# Patient Record
Sex: Male | Born: 1965 | Race: White | Hispanic: No | Smoking: Current every day smoker
Health system: Southern US, Community
[De-identification: ages and names within clinical notes are randomized; demographics above are authoritative.]

## PROBLEM LIST (undated history)

## (undated) DIAGNOSIS — F209 Schizophrenia, unspecified: Secondary | ICD-10-CM

## (undated) DIAGNOSIS — N200 Calculus of kidney: Secondary | ICD-10-CM

---

## 2004-10-15 ENCOUNTER — Other Ambulatory Visit: Payer: Self-pay

## 2004-10-15 ENCOUNTER — Emergency Department: Payer: Self-pay | Admitting: Emergency Medicine

## 2005-10-09 ENCOUNTER — Other Ambulatory Visit: Payer: Self-pay

## 2005-10-09 ENCOUNTER — Emergency Department: Payer: Self-pay | Admitting: Emergency Medicine

## 2005-11-03 ENCOUNTER — Emergency Department: Payer: Self-pay | Admitting: Emergency Medicine

## 2005-11-04 ENCOUNTER — Other Ambulatory Visit: Payer: Self-pay

## 2005-12-30 ENCOUNTER — Emergency Department: Payer: Self-pay | Admitting: Internal Medicine

## 2006-06-09 ENCOUNTER — Other Ambulatory Visit: Payer: Self-pay

## 2006-06-09 ENCOUNTER — Emergency Department: Payer: Self-pay | Admitting: Emergency Medicine

## 2006-06-30 ENCOUNTER — Emergency Department: Payer: Self-pay | Admitting: Emergency Medicine

## 2007-08-08 IMAGING — CT CT CHEST-ABD-PELV W/ CM
2 of 4 series · 13 of 36 positions shown, 19 images · IV contrast (APPLIED)
Comparison: none

REASON FOR EXAM: Chest pain
COMMENTS:

[Series 4: soft tissue · axial · 0.76mm/px · z∈[-696,-142]mm · 10 of 227 slices shown, 16 images]
[im 21/227  mediastinal]
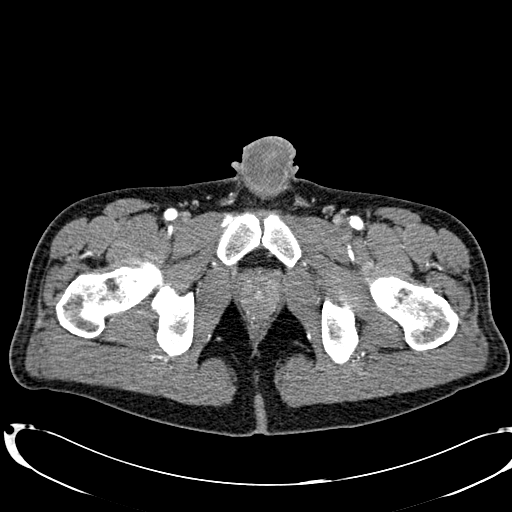
[im 21/227  bone]
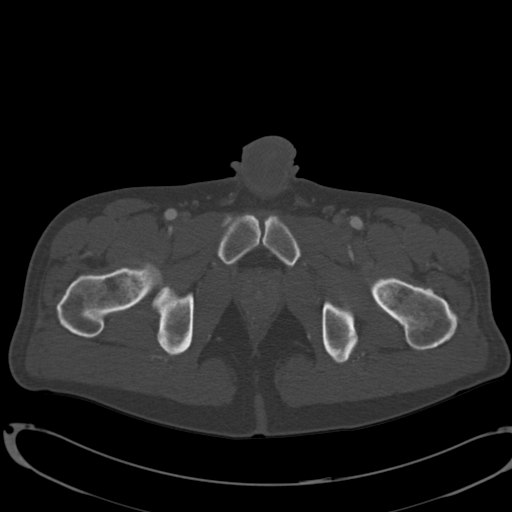
[im 42/227  mediastinal]
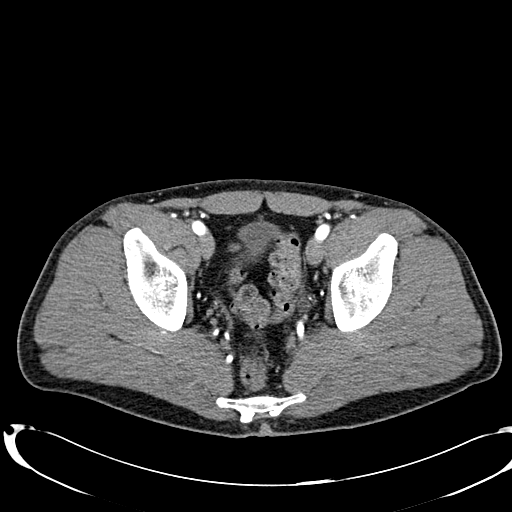
[im 62/227  mediastinal]
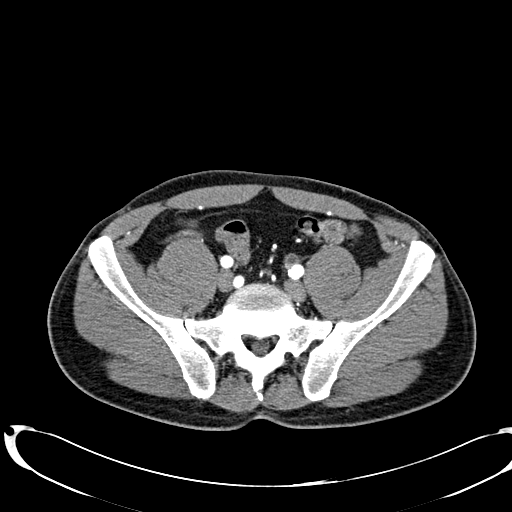
[im 83/227  mediastinal]
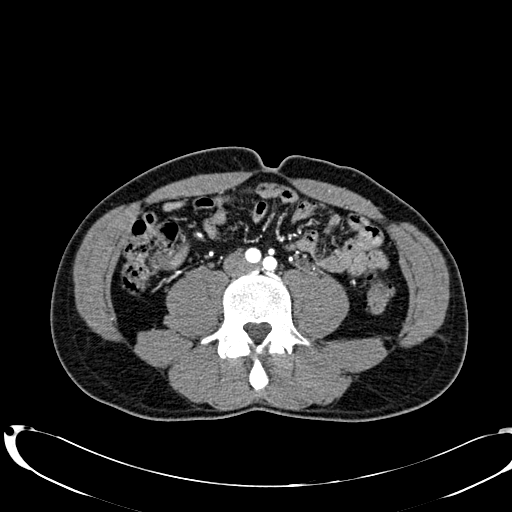
[im 103/227  mediastinal]
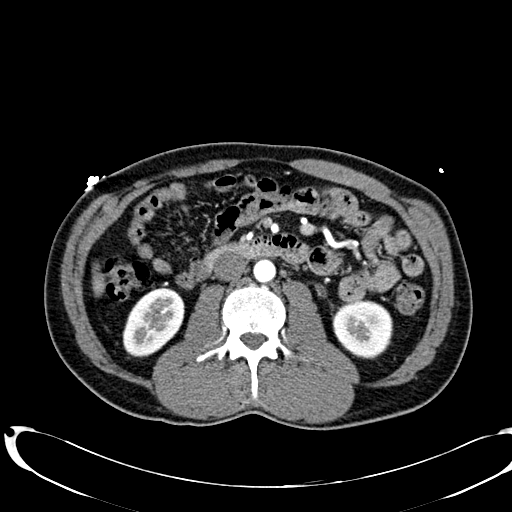
[im 124/227  mediastinal]
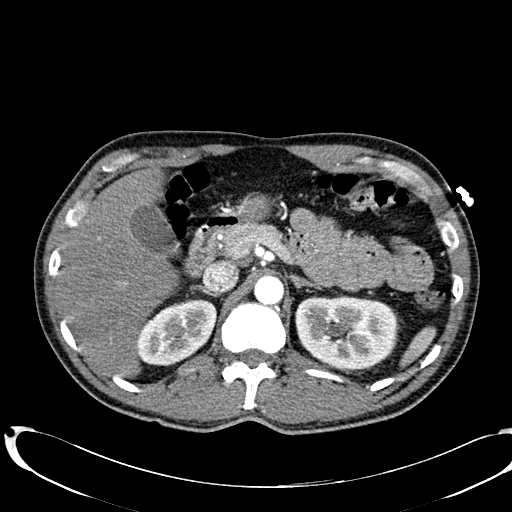
[im 144/227  mediastinal]
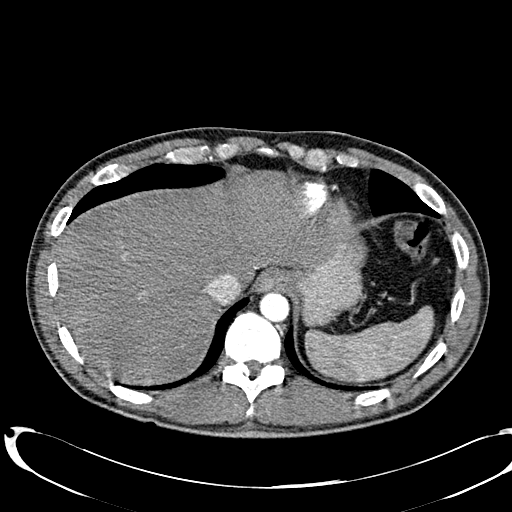
[im 144/227  lung]
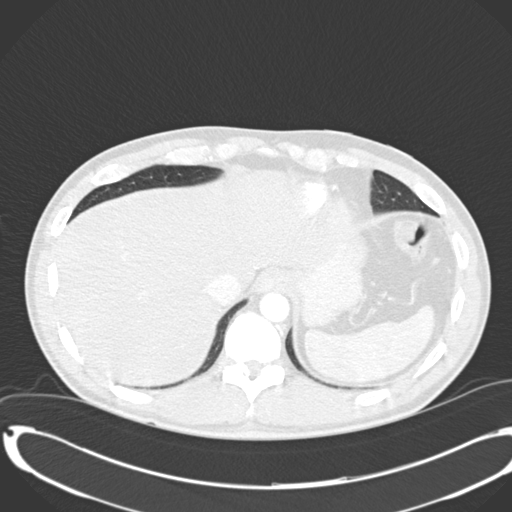
[im 165/227  mediastinal]
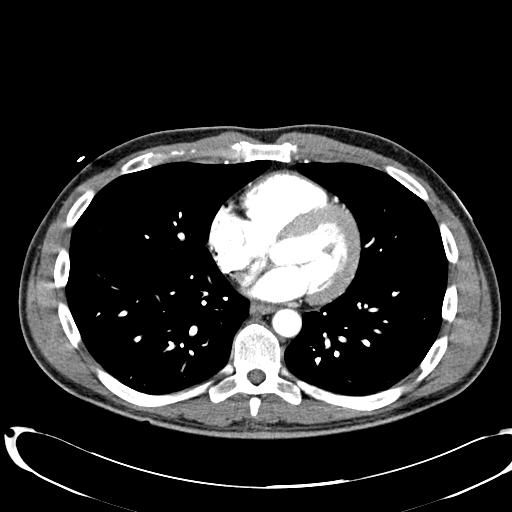
[im 165/227  lung]
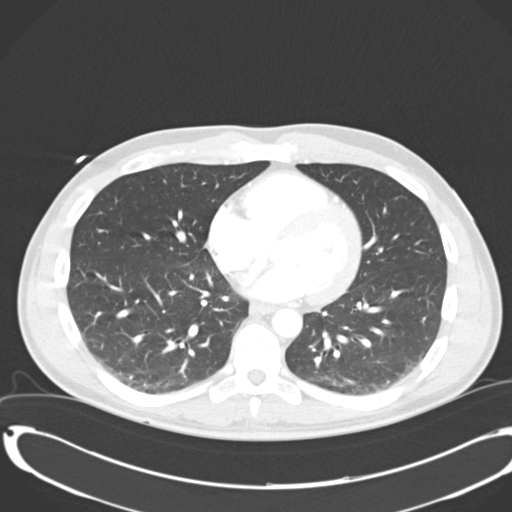
[im 185/227  mediastinal]
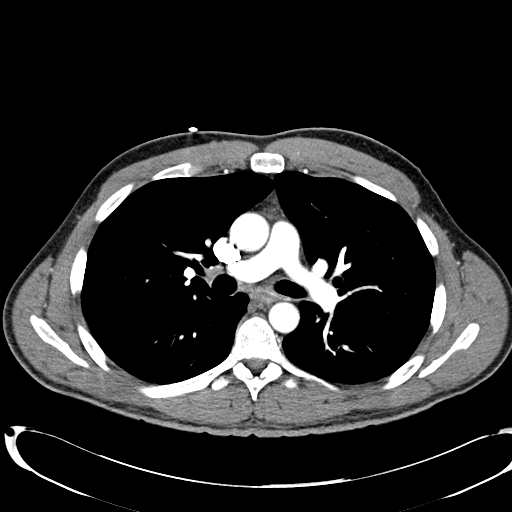
[im 185/227  lung]
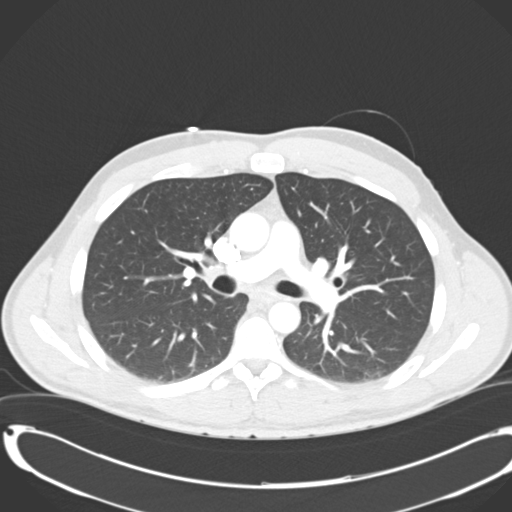
[im 185/227  bone]
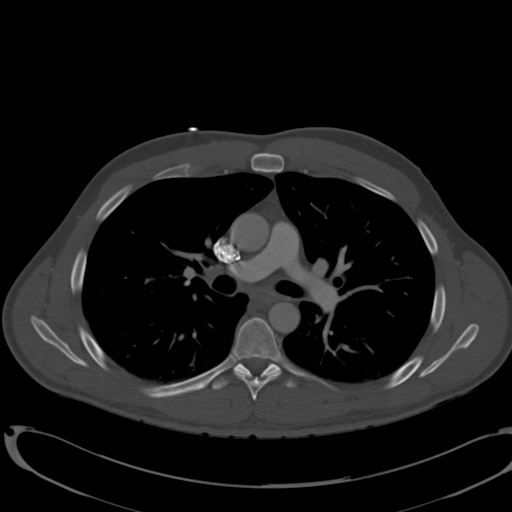
[im 206/227  mediastinal]
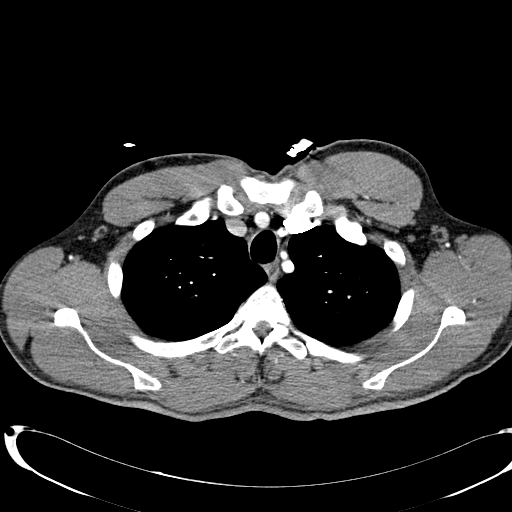
[im 206/227  lung]
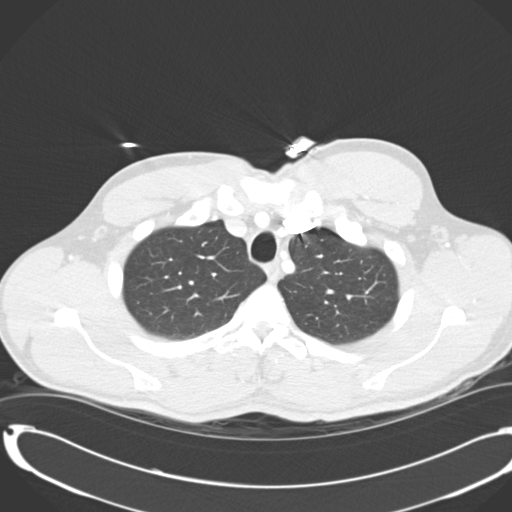

[Series 602: coronals · coronal · 1.33mm/px · 3 of 65 slices shown]
[im 13/65  mediastinal]
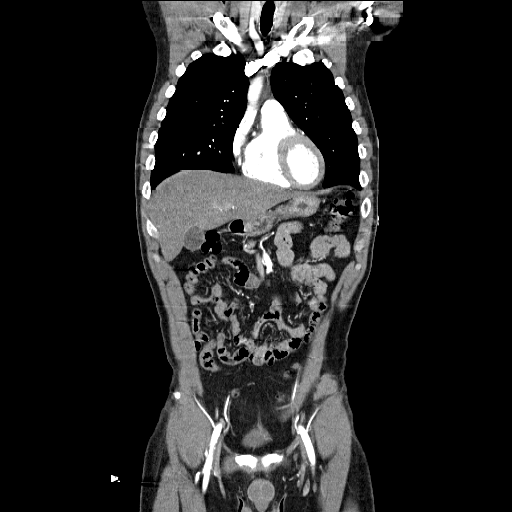
[im 26/65  mediastinal]
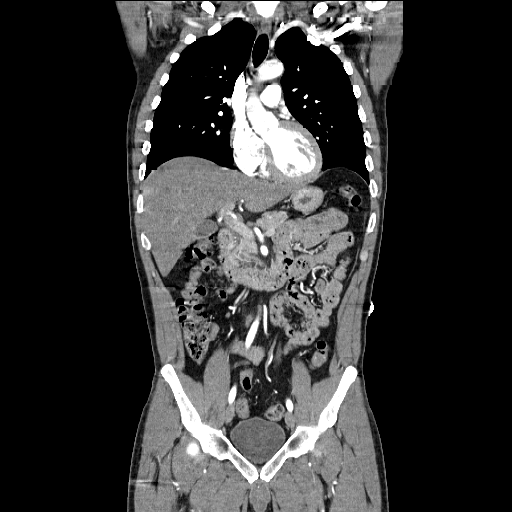
[im 39/65  mediastinal]
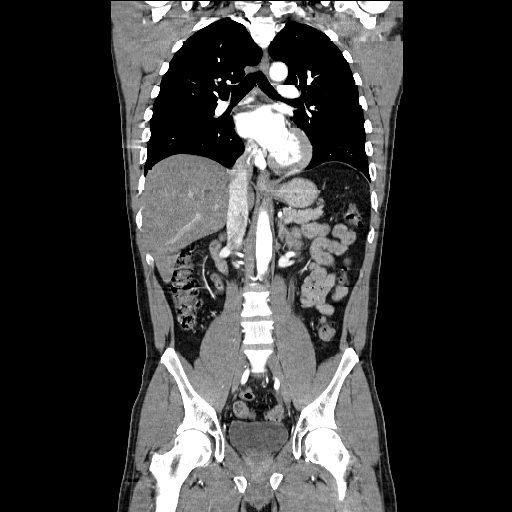

[13 of 36 positions shown; findings below may reference images not displayed]

PROCEDURE:     CT  - CT CHEST ABDOMEN AND PELVIS W  - October 09, 2005  [DATE]

RESULT:          An emergent contrast study was performed for severe back
pain and loss of consciousness.  Aortic dissection is of concern.

The exam was originally read by the [HOSPITAL].  On the chest CT, the
thoracic aorta appears intact.  No dissection is identified.  Within the
abdomen, no abdominal dissection is seen.  No aneurysm is noted.  A good
bolus of contrast is noted within the pulmonary arteries with no filling
defects noted to suggest pulmonary embolus.

Within the mediastinum, no mediastinal masses are identified, no hilar or
subcarinal adenopathy.  The lung fields are clear on the lung window
settings with no effusions noted.

Within the abdomen, there is seen a liver cyst present.  Both kidneys
excrete the contrast material.  No major organ abnormalities are seen.  No
fluid is noted in the abdomen.  There is noted mild LEFT hydronephrosis and
a 4 mm calculus at the LEFT ureterovesical junction.
IMPRESSION: 1.     No aortic dissection in the thorax or abdomen.
2.     No aneurysm is seen.
3.     A 4 mm calculus in the LEFT ureterovesical junction with LEFT
hydronephrosis.

## 2008-09-05 ENCOUNTER — Inpatient Hospital Stay: Payer: Self-pay | Admitting: Psychiatry

## 2008-10-07 ENCOUNTER — Inpatient Hospital Stay: Payer: Self-pay | Admitting: Psychiatry

## 2009-10-02 ENCOUNTER — Inpatient Hospital Stay: Payer: Self-pay | Admitting: Psychiatry

## 2013-01-12 ENCOUNTER — Emergency Department (HOSPITAL_COMMUNITY)
Admission: EM | Admit: 2013-01-12 | Discharge: 2013-01-12 | Disposition: A | Discharge status: Other Acute Inpatient Hospital | Payer: 59 | Attending: Emergency Medicine | Admitting: Emergency Medicine

## 2013-01-12 ENCOUNTER — Encounter (HOSPITAL_COMMUNITY): Payer: Self-pay

## 2013-01-12 DIAGNOSIS — F29 Unspecified psychosis not due to a substance or known physiological condition: Secondary | ICD-10-CM | POA: Insufficient documentation

## 2013-01-12 DIAGNOSIS — F329 Major depressive disorder, single episode, unspecified: Secondary | ICD-10-CM

## 2013-01-12 DIAGNOSIS — F3289 Other specified depressive episodes: Secondary | ICD-10-CM | POA: Insufficient documentation

## 2013-01-12 DIAGNOSIS — F1410 Cocaine abuse, uncomplicated: Secondary | ICD-10-CM | POA: Insufficient documentation

## 2013-01-12 LAB — RAPID URINE DRUG SCREEN, HOSP PERFORMED
Amphetamines: NOT DETECTED
Cocaine: POSITIVE — AB
Opiates: NOT DETECTED
Tetrahydrocannabinol: NOT DETECTED

## 2013-01-12 LAB — CBC WITH DIFFERENTIAL/PLATELET
Hemoglobin: 13.9 g/dL (ref 13.0–17.0)
Lymphocytes Relative: 25 % (ref 12–46)
Lymphs Abs: 1.5 10*3/uL (ref 0.7–4.0)
MCV: 94.3 fL (ref 78.0–100.0)
Neutrophils Relative %: 62 % (ref 43–77)
Platelets: 225 10*3/uL (ref 150–400)
RBC: 4.19 MIL/uL — ABNORMAL LOW (ref 4.22–5.81)
WBC: 6.1 10*3/uL (ref 4.0–10.5)

## 2013-01-12 LAB — COMPREHENSIVE METABOLIC PANEL
ALT: 46 U/L (ref 0–53)
Alkaline Phosphatase: 72 U/L (ref 39–117)
CO2: 27 mEq/L (ref 19–32)
GFR calc Af Amer: >90 mL/min (ref >90)
GFR calc non Af Amer: >90 mL/min (ref >90)
Glucose, Bld: 108 mg/dL — ABNORMAL HIGH (ref 70–99)
Potassium: 3.6 mEq/L (ref 3.5–5.1)
Sodium: 140 mEq/L (ref 135–145)
Total Bilirubin: 0.4 mg/dL (ref 0.3–1.2)

## 2013-01-12 LAB — URINALYSIS, ROUTINE W REFLEX MICROSCOPIC
Bilirubin Urine: NEGATIVE
Ketones, ur: NEGATIVE mg/dL
Nitrite: NEGATIVE
Urobilinogen, UA: 1 mg/dL (ref 0.0–1.0)

## 2013-01-12 MED ORDER — ACETAMINOPHEN 325 MG PO TABS: 650.0000 mg | ORAL_TABLET | ORAL | Status: DC | PRN

## 2013-01-12 MED ORDER — LORAZEPAM 1 MG PO TABS: 1.0000 mg | ORAL_TABLET | Freq: Three times a day (TID) | ORAL | Status: DC | PRN

## 2013-01-12 MED ORDER — BUPROPION HCL 75 MG PO TABS: 225.0000 mg | ORAL_TABLET | Freq: Every day | ORAL | Status: DC

## 2013-01-12 MED ORDER — ONDANSETRON HCL 4 MG PO TABS: 4.0000 mg | ORAL_TABLET | Freq: Three times a day (TID) | ORAL | Status: DC | PRN

## 2013-01-12 MED ORDER — ALUM & MAG HYDROXIDE-SIMETH 200-200-20 MG/5ML PO SUSP: 30.0000 mL | ORAL | Status: DC | PRN

## 2013-01-12 MED ORDER — ZIPRASIDONE MESYLATE 20 MG IM SOLR
20.0000 mg | Freq: Once | INTRAMUSCULAR | Status: AC
  Administered 2013-01-12: 20 mg via INTRAMUSCULAR

## 2013-01-12 MED ORDER — BUPROPION HCL 75 MG PO TABS
225.0000 mg | ORAL_TABLET | Freq: Two times a day (BID) | ORAL | Status: DC
  Filled 2013-01-12 (×4): qty 3

## 2013-01-12 MED ORDER — ZOLPIDEM TARTRATE 5 MG PO TABS: 5.0000 mg | ORAL_TABLET | Freq: Every evening | ORAL | Status: DC | PRN

## 2013-01-12 MED ORDER — IBUPROFEN 400 MG PO TABS: 600.0000 mg | ORAL_TABLET | Freq: Three times a day (TID) | ORAL | Status: DC | PRN

## 2013-01-12 NOTE — ED Provider Notes (Signed)
History  This chart was scribed for Dione Booze, MD,  by Concha Se, ED Scribe. The patient was seen in room APA16A/APA16A and the patient's care was started at 10:00 AM    CSN: [id number]  Arrival date & time 01/12/13  7829     Chief Complaint  Patient presents with  . V70.1   Level 5 Caveat- Pt psychiatric disorder  The history is provided by the patient and the police. No language interpreter was used.   HPI Comments: Harold Brooks is a 47 y.o. male who presents to the Emergency Department with [facility] for medical clearance.  Pt's family called police after pt stated that he was going to take an overdose of pills and when police came, he stated that he was talking to God. Pt is resistant to giving any previous social and medical history stating "I ain't got nothing to say to you". He has a h/o prior suicide attempts. He denies any suicidal or homicidal thoughts currently. Pt states frequent hallucinations but denies any today.  Pt states he is currently using marijuana and cocaine, last use of both has been within in the past 2 days. Pt is a current smoker and consumes alcohol.    No past medical history on file.  No past surgical history on file.  No family history on file.  History  Substance Use Topics  . Smoking status: Not on file  . Smokeless tobacco: Not on file  . Alcohol Use: Not on file      Review of Systems  Unable to perform ROS: Psychiatric disorder    Allergies  Review of patient's allergies indicates not on file.  Home Medications  No current outpatient prescriptions on file.  BP 156/82  Pulse 116  Resp 23  SpO2 98%  Physical Exam  Nursing note and vitals reviewed. Constitutional: He is oriented to person, place, and time. He appears well-developed and well-nourished. No distress.  uncooperative   HENT:  Head: Normocephalic and atraumatic.  Eyes: EOM are normal.  Neck: Neck supple. No tracheal deviation present.  Cardiovascular: Normal  rate.   Pulmonary/Chest: Effort normal. No respiratory distress.  Musculoskeletal: Normal range of motion.  Neurological: He is alert and oriented to person, place, and time.  Skin: Skin is warm and dry.  Psychiatric: His behavior is normal.  Flat affect    ED Course  Procedures (including critical care time)  DIAGNOSTIC STUDIES: Oxygen Saturation is 98% on room air, normal by my interpretation.    COORDINATION OF CARE: 10:01 AM-Discussed ED treatment with pt and pt agrees to treatment plan.   Results for orders placed during the hospital encounter of 01/12/13  CBC WITH DIFFERENTIAL      Result Value Range   WBC 6.1  4.0 - 10.5 K/uL   RBC 4.19 (*) 4.22 - 5.81 MIL/uL   Hemoglobin 13.9  13.0 - 17.0 g/dL   HCT 56.2  13.0 - 86.5 %   MCV 94.3  78.0 - 100.0 fL   MCH 33.2  26.0 - 34.0 pg   MCHC 35.2  30.0 - 36.0 g/dL   RDW 78.4  69.6 - 29.5 %   Platelets 225  150 - 400 K/uL   Neutrophils Relative % 62  43 - 77 %   Neutro Abs 3.7  1.7 - 7.7 K/uL   Lymphocytes Relative 25  12 - 46 %   Lymphs Abs 1.5  0.7 - 4.0 K/uL   Monocytes Relative 11  3 - 12 %  Monocytes Absolute 0.6  0.1 - 1.0 K/uL   Eosinophils Relative 3  0 - 5 %   Eosinophils Absolute 0.2  0.0 - 0.7 K/uL   Basophils Relative 1  0 - 1 %   Basophils Absolute 0.0  0.0 - 0.1 K/uL  COMPREHENSIVE METABOLIC PANEL      Result Value Range   Sodium 140  135 - 145 mEq/L   Potassium 3.6  3.5 - 5.1 mEq/L   Chloride 103  96 - 112 mEq/L   CO2 27  19 - 32 mEq/L   Glucose, Bld 108 (*) 70 - 99 mg/dL   BUN 15  6 - 23 mg/dL   Creatinine, Ser 5.62  0.50 - 1.35 mg/dL   Calcium 9.0  8.4 - 13.0 mg/dL   Total Protein 6.9  6.0 - 8.3 g/dL   Albumin 4.0  3.5 - 5.2 g/dL   AST 29  0 - 37 U/L   ALT 46  0 - 53 U/L   Alkaline Phosphatase 72  39 - 117 U/L   Total Bilirubin 0.4  0.3 - 1.2 mg/dL   GFR calc non Af Amer >90  >90 mL/min   GFR calc Af Amer >90  >90 mL/min  URINALYSIS, ROUTINE W REFLEX MICROSCOPIC      Result Value Range    Color, Urine YELLOW  YELLOW   APPearance HAZY (*) CLEAR   Specific Gravity, Urine 1.010  1.005 - 1.030   pH 7.0  5.0 - 8.0   Glucose, UA NEGATIVE  NEGATIVE mg/dL   Hgb urine dipstick NEGATIVE  NEGATIVE   Bilirubin Urine NEGATIVE  NEGATIVE   Ketones, ur NEGATIVE  NEGATIVE mg/dL   Protein, ur NEGATIVE  NEGATIVE mg/dL   Urobilinogen, UA 1.0  0.0 - 1.0 mg/dL   Nitrite NEGATIVE  NEGATIVE   Leukocytes, UA NEGATIVE  NEGATIVE  URINE RAPID DRUG SCREEN (HOSP PERFORMED)      Result Value Range   Opiates NONE DETECTED  NONE DETECTED   Cocaine POSITIVE (*) NONE DETECTED   Benzodiazepines NONE DETECTED  NONE DETECTED   Amphetamines NONE DETECTED  NONE DETECTED   Tetrahydrocannabinol NONE DETECTED  NONE DETECTED   Barbiturates NONE DETECTED  NONE DETECTED  ETHANOL      Result Value Range   Alcohol, Ethyl (B) <11  0 - 11 mg/dL    1. Depression   2. Cocaine abuse     CRITICAL CARE Performed by: Dione Booze Total critical care time: 35 minutes Critical care time was exclusive of separately billable procedures and treating other patients. Critical care was necessary to treat or prevent imminent or life-threatening deterioration. Critical care was time spent personally by me on the following activities: development of treatment plan with patient and/or surrogate as well as nursing, discussions with consultants, evaluation of patient's response to treatment, examination of patient, obtaining history from patient or surrogate, ordering and performing treatments and interventions, ordering and review of laboratory studies, ordering and review of radiographic studies, pulse oximetry and re-evaluation of patient's condition.   MDM  Patient with apparent acute psychosis. He is uncooperative and I am unable to do a good psychiatric evaluation on him. He was combative at triage and required sedation with ziprasidone. Psychiatric consultation will be obtained as well as screening labs. Records from  [facility] were reviewed and he presented to the ED with acute psychosis but apparently improved over the ensuing several days and was discharged without actual admission to a psychiatric hospital.  Laboratory workup does confirm that he has been taking cocaine. Psychiatric consultation is appreciated. Psychiatrist is Dr. that he is acutely suicidal or homicidal and not in need of acute psychiatric care but she is in need of substance abuse treatment. Consultation will be obtained with ACT Team.  I personally performed the services described in this documentation, which was scribed in my presence. The recorded information has been reviewed and is accurate.     Dione Booze, MD 01/12/13 1520
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
## 2013-01-12 NOTE — ED Notes (Signed)
Sitter remains at bedside, pt refused vitals at present, states "can't you see I'm eating!", will attempt to get vitals when pt done eating meal tray

## 2013-01-12 NOTE — ED Notes (Signed)
Pt's daughter called concerning father, contact number is 3 [phone] ( cell ), Melissa Suppes

## 2013-01-12 NOTE — Progress Notes (Signed)
Pt accepted by [facility] to Adult unit.

## 2013-01-12 NOTE — ED Notes (Signed)
Pt in with police. Pt combative, cursing and uncooperative. Per police officer pt was released from [facility] last week. Pt was making comments to family members he was going to take a hand full of meds. Per family pt had attempted to hang himself in the past. Pt

## 2013-01-12 NOTE — ED Notes (Signed)
Pt had bottle of clonazepm that was filled on 01/10/14. # 60, 1 mg tabs to be taken twice daily. Only 19 pill left in bottle.

## 2013-01-12 NOTE — ED Notes (Signed)
geodon 20 mg IM given by J. Verner Mould, RN prior to pt being triaged.

## 2013-01-12 NOTE — ED Provider Notes (Signed)
Pt accepted at [facility] by Dr. Eloisa Northern. IVC papers taken out due to reports of suicidal thoughts expressed to sister earlier today when he apparently called her and said he was 'in kidney failure'.   Quintavius Niebuhr B. Bernette Mayers, MD 01/12/13 2148

## 2013-01-12 NOTE — ED Notes (Signed)
Pt waking up. States he is cold and hungry. Warm blanket given and lunch tray. Officer has uncuffed pt but still remains here with pt.

## 2013-01-13 ENCOUNTER — Other Ambulatory Visit: Payer: Self-pay | Admitting: Medical

## 2013-01-13 ENCOUNTER — Encounter (HOSPITAL_COMMUNITY): Payer: Self-pay | Admitting: *Deleted

## 2013-01-13 ENCOUNTER — Inpatient Hospital Stay (HOSPITAL_COMMUNITY)
Admission: AD | Admit: 2013-01-13 | Discharge: 2013-01-17 | DRG: 885 | Disposition: A | Discharge status: Home / Self Care | Payer: Medicaid Other | Source: Intra-hospital | Attending: Psychiatry | Admitting: Psychiatry

## 2013-01-13 DIAGNOSIS — F1410 Cocaine abuse, uncomplicated: Secondary | ICD-10-CM | POA: Diagnosis present

## 2013-01-13 DIAGNOSIS — Z79899 Other long term (current) drug therapy: Secondary | ICD-10-CM

## 2013-01-13 DIAGNOSIS — F4310 Post-traumatic stress disorder, unspecified: Secondary | ICD-10-CM | POA: Diagnosis present

## 2013-01-13 DIAGNOSIS — F39 Unspecified mood [affective] disorder: Principal | ICD-10-CM | POA: Diagnosis present

## 2013-01-13 HISTORY — DX: Calculus of kidney: N20.0

## 2013-01-13 MED ORDER — CHLORDIAZEPOXIDE HCL 25 MG PO CAPS
25.0000 mg | ORAL_CAPSULE | Freq: Three times a day (TID) | ORAL | Status: DC | PRN
  Administered 2013-01-13 – 2013-01-14 (×4): 25 mg via ORAL
  Filled 2013-01-13 (×4): qty 1

## 2013-01-13 MED ORDER — ALUM & MAG HYDROXIDE-SIMETH 200-200-20 MG/5ML PO SUSP
30.0000 mL | ORAL | Status: DC | PRN
  Administered 2013-01-14: 30 mL via ORAL

## 2013-01-13 MED ORDER — HALOPERIDOL 5 MG PO TABS
5.0000 mg | ORAL_TABLET | Freq: Two times a day (BID) | ORAL | Status: DC
  Administered 2013-01-13: 5 mg via ORAL
  Filled 2013-01-13 (×5): qty 1

## 2013-01-13 MED ORDER — BUPROPION HCL 75 MG PO TABS
150.0000 mg | ORAL_TABLET | Freq: Two times a day (BID) | ORAL | Status: DC
  Administered 2013-01-13 – 2013-01-14 (×3): 150 mg via ORAL
  Filled 2013-01-13 (×7): qty 2

## 2013-01-13 MED ORDER — HYDROXYZINE HCL 50 MG PO TABS
50.0000 mg | ORAL_TABLET | Freq: Every evening | ORAL | Status: DC | PRN
  Administered 2013-01-13 – 2013-01-14 (×2): 50 mg via ORAL
  Filled 2013-01-13 (×3): qty 1

## 2013-01-13 MED ORDER — BENZTROPINE MESYLATE 1 MG PO TABS
1.0000 mg | ORAL_TABLET | Freq: Two times a day (BID) | ORAL | Status: DC
  Administered 2013-01-13 – 2013-01-17 (×10): 1 mg via ORAL
  Filled 2013-01-13 (×13): qty 1

## 2013-01-13 MED ORDER — CHLORPROMAZINE HCL 100 MG PO TABS
100.0000 mg | ORAL_TABLET | Freq: Every day | ORAL | Status: DC
  Administered 2013-01-13 – 2013-01-15 (×3): 100 mg via ORAL
  Filled 2013-01-13: qty 2
  Filled 2013-01-13 (×5): qty 1

## 2013-01-13 MED ORDER — ONDANSETRON HCL 4 MG PO TABS: 4.0000 mg | ORAL_TABLET | Freq: Three times a day (TID) | ORAL | Status: DC | PRN

## 2013-01-13 MED ORDER — GABAPENTIN 100 MG PO CAPS
200.0000 mg | ORAL_CAPSULE | Freq: Three times a day (TID) | ORAL | Status: DC
  Administered 2013-01-13 – 2013-01-14 (×5): 200 mg via ORAL
  Filled 2013-01-13 (×10): qty 2

## 2013-01-13 MED ORDER — IBUPROFEN 600 MG PO TABS
600.0000 mg | ORAL_TABLET | Freq: Three times a day (TID) | ORAL | Status: DC | PRN
  Administered 2013-01-13 – 2013-01-15 (×5): 600 mg via ORAL
  Filled 2013-01-13 (×5): qty 1

## 2013-01-13 MED ORDER — LORAZEPAM 1 MG PO TABS
1.0000 mg | ORAL_TABLET | ORAL | Status: AC
  Administered 2013-01-13: 1 mg via ORAL
  Filled 2013-01-13: qty 1

## 2013-01-13 MED ORDER — MAGNESIUM HYDROXIDE 400 MG/5ML PO SUSP: 30.0000 mL | Freq: Every day | ORAL | Status: DC | PRN

## 2013-01-13 MED ORDER — BUPROPION HCL 75 MG PO TABS
225.0000 mg | ORAL_TABLET | Freq: Two times a day (BID) | ORAL | Status: DC
  Filled 2013-01-13 (×3): qty 3

## 2013-01-13 MED ORDER — CLONAZEPAM 0.5 MG PO TABS
0.5000 mg | ORAL_TABLET | Freq: Two times a day (BID) | ORAL | Status: DC
  Administered 2013-01-13: 0.5 mg via ORAL
  Filled 2013-01-13: qty 1

## 2013-01-13 MED ORDER — CLONAZEPAM 0.5 MG PO TABS: 0.5000 mg | ORAL_TABLET | Freq: Three times a day (TID) | ORAL | Status: DC

## 2013-01-13 MED ORDER — CHLORPROMAZINE HCL 50 MG PO TABS
50.0000 mg | ORAL_TABLET | Freq: Three times a day (TID) | ORAL | Status: DC
  Administered 2013-01-14 – 2013-01-17 (×10): 50 mg via ORAL
  Filled 2013-01-13 (×13): qty 1

## 2013-01-13 MED ORDER — CHLORPROMAZINE HCL 100 MG PO TABS
100.0000 mg | ORAL_TABLET | ORAL | Status: AC | PRN
  Administered 2013-01-13: 100 mg via ORAL
  Filled 2013-01-13: qty 4

## 2013-01-13 MED ORDER — CHLORPROMAZINE HCL 50 MG PO TABS
50.0000 mg | ORAL_TABLET | Freq: Four times a day (QID) | ORAL | Status: DC
  Administered 2013-01-13 (×2): 50 mg via ORAL
  Filled 2013-01-13 (×8): qty 1

## 2013-01-13 MED ORDER — ACETAMINOPHEN 325 MG PO TABS
650.0000 mg | ORAL_TABLET | ORAL | Status: DC | PRN
  Administered 2013-01-13 – 2013-01-14 (×2): 650 mg via ORAL
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
## 2013-01-13 NOTE — Progress Notes (Signed)
Pt reports that he takes haldol 10 mg twice a day, cogentin 2 mg twice a day, klonopin 1 mg three times a day, and wellbutrin 150 mg daily

## 2013-01-13 NOTE — [facility] Group Notes (Signed)
[facility] Group Notes: (Clinical Social Work)   01/13/2013      Type of Therapy:  Group Therapy   Participation Level:  Did Not Attend    Ambrose Mantle, LCSW 01/13/2013, 5:38 PM

## 2013-01-13 NOTE — Progress Notes (Signed)
Psychoeducational Group Note  Date:  01/13/2013 Time:  1030  Group Topic/Focus:  Healthy Communication:   The focus of this group is to discuss communication, barriers to communication, as well as healthy ways to communicate with others.  Participation Level: Did Not Attend  Participation Quality:  Not Applicable  Affect:  Not Applicable  Cognitive:  Not Applicable  Insight:  Not Applicable  Engagement in Group: Not Applicable  Additional Comments:  Pt remained in room and walking the hall during group.   Sharyn Lull 01/13/2013, 1:28 PM

## 2013-01-13 NOTE — Tx Team (Signed)
Initial Interdisciplinary Treatment Plan  PATIENT STRENGTHS: (choose at least two) Ability for insight Average or above average intelligence  PATIENT STRESSORS: Medication change or noncompliance Substance abuse   PROBLEM LIST: Problem List/Patient Goals Date to be addressed Date deferred Reason deferred Estimated date of resolution  Depression 01/13/13     Substance Abuse 01/13/13                                                DISCHARGE CRITERIA:  Ability to meet basic life and health needs Improved stabilization in mood, thinking, and/or behavior Verbal commitment to aftercare and medication compliance  PRELIMINARY DISCHARGE PLAN: Attend aftercare/continuing care group Return to previous living arrangement  PATIENT/FAMIILY INVOLVEMENT: This treatment plan has been presented to and reviewed with the patient, Harold Brooks, and/or family member, .  The patient and family have been given the opportunity to ask questions and make suggestions.  Stacye Noori, [place] 01/13/2013, 2:05 AM

## 2013-01-13 NOTE — Progress Notes (Signed)
D: pt demonstrated a level of  of anxiety with agitation wanting to know why he was brought here and not taking his home medications. Also reports meds not working and if he does not have meds, he could act out. Has isolated himself from group verbalizing that he does not want anyone knowing his business; therefore he would not attend groups. Spent most of his day in bed sleeping.  A: pt met with psychiatrist and some changes were made to medications. Pt also encouraged by this rn to follow thru with treatment plan to allow for better results. This rn has had repeated verbal contacts with pt to encourage a change in behavior. 15 minute checks continues. R: decreased in anxiety noted. Pt is calm and more comfortable allowing expressions of feelings. Overall safety is maintained.
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
## 2013-01-14 MED ORDER — BUPROPION HCL ER (XL) 300 MG PO TB24
300.0000 mg | ORAL_TABLET | Freq: Every day | ORAL | Status: DC
  Administered 2013-01-15 – 2013-01-17 (×3): 300 mg via ORAL
  Filled 2013-01-14 (×6): qty 1

## 2013-01-14 MED ORDER — GABAPENTIN 300 MG PO CAPS
300.0000 mg | ORAL_CAPSULE | Freq: Three times a day (TID) | ORAL | Status: DC
  Administered 2013-01-14 – 2013-01-17 (×10): 300 mg via ORAL
  Filled 2013-01-14 (×14): qty 1

## 2013-01-14 MED ORDER — NICOTINE 21 MG/24HR TD PT24
21.0000 mg | MEDICATED_PATCH | Freq: Every day | TRANSDERMAL | Status: DC
  Administered 2013-01-14 – 2013-01-17 (×4): 21 mg via TRANSDERMAL
  Filled 2013-01-14 (×7): qty 1

## 2013-01-14 NOTE — Progress Notes (Signed)
Psychoeducational Group Note  Psychoeducational Group Note  Date: 01/14/2013 Time:  01/14/2013  Group Topic/Focus:  Gratefulness:  The focus of this group is to help patients identify what two things they are most grateful for in their lives. What helps ground them and to center them on their work to their recovery.  Participation Level: Did not attend Additional Comments:   Dione Housekeeper

## 2013-01-14 NOTE — [facility] Group Notes (Signed)
[facility] Group Notes: (Clinical Social Work)   01/14/2013      Type of Therapy:  Group Therapy   Participation Level:  Did Not Attend    Ambrose Mantle, LCSW 01/14/2013, 5:02 PM

## 2013-01-14 NOTE — Progress Notes (Signed)
D. Pt has been up periodically during the day today, limited interaction or participation in various activities. Pt has endorsed feelings of anxiety today and racing thoughts and did speak about how he rested well last evening. Pt has also complained of pain and has requested and received prn medication to assist. A. Support and encouragement provided, medication education completed. R. Pt verbalized understanding, will continue to monitor.

## 2013-01-14 NOTE — Clinical Social Work Note (Signed)
Clincal Social Work Note  Patient was irritable when awoken by CSW for PSA, complaining that he was not told when it was time for breakfast.  The MHT heated his breakfast which had been brought back to the unit, but he remained complaining and cursing.  He refused to answer any questions for the PSA and walked out, saying "I don't have time for any of that bulls---."  Ambrose Mantle, LCSW 01/14/2013, 8:55 AM

## 2013-01-14 NOTE — Progress Notes (Signed)
Psychoeducational Group Note  Date:  01/14/2013 Time:  1015  Group Topic/Focus:  Making Healthy Choices:   The focus of this group is to help patients identify negative/unhealthy choices they were using prior to admission and identify positive/healthier coping strategies to replace them upon discharge.  Participation Level:  Did not attend Harold Brooks 01/14/2013

## 2013-01-14 NOTE — Progress Notes (Signed)
[facility] MD Progress Note  01/14/2013 12:39 PM Harold Brooks  MRN:  [id number] Subjective:  Did sleep better last night. Feels that the two dosages of Thorazine he received in the evening might have been too much. Still wanting to be on a higher dose of Klonopin or "something stronger." He states that when he leaves he has a place to stay. Wants the medications to be right before he goes. He is sill admits to mood instability, racing thoughts.  Diagnosis:  Mood Disorder NOS, PTSD, Cocaine Abuse  ADL's:  Intact  Sleep: Fair  Appetite:  Fair  Suicidal Ideation:  Plan:  denies Intent:  denies Means:  denies Homicidal Ideation:  Plan:  denies Intent:  denies Means:  denies AEB (as evidenced by):  Psychiatric Specialty Exam: Review of Systems  Constitutional: Negative.   HENT: Negative.   Eyes: Negative.   Respiratory: Negative.   Cardiovascular: Negative.   Gastrointestinal: Negative.   Genitourinary: Negative.   Musculoskeletal: Negative.   Skin: Negative.   Neurological: Negative.   Endo/Heme/Allergies: Negative.   Psychiatric/Behavioral: Positive for depression, suicidal ideas and substance abuse. The patient is nervous/anxious and has insomnia.     Blood pressure 87/49, pulse 106, temperature 97.4 F (36.3 C), temperature source Oral, resp. rate 21, height 5\' 11"  (1.803 m), weight 72.122 kg (159 lb).Body mass index is 22.19 kg/(m^2).  General Appearance: Disheveled  Eye Solicitor::  Fair  Speech:  Clear and Coherent  Volume:  fluctuates  Mood:  Anxious and Irritable  Affect:  Labile  Thought Process:  Coherent and Goal Directed  Orientation:  Full (Time, Place, and Person)  Thought Content:  symptoms, medication focused, vague   Suicidal Thoughts:  Yes.  without intent/plan  Homicidal Thoughts:  No  Memory:  Immediate;   Fair Recent;   Fair Remote;   Fair  Judgement:  Fair  Insight:  Shallow  Psychomotor Activity:  Restlessness  Concentration:  Fair  Recall:  Fair   Akathisia:  No  Handed:  Right  AIMS (if indicated):     Assets:  Desire for Improvement  Sleep:  Number of Hours: 5.75   Current Medications: Current Facility-Administered Medications  Medication Dose Route Frequency Provider Last Rate Last Dose  . acetaminophen (TYLENOL) tablet 650 mg  650 mg Oral Q4H PRN Court Joy, PA-C   650 mg at 01/13/13 1904  . alum & mag hydroxide-simeth (MAALOX/MYLANTA) 200-200-20 MG/5ML suspension 30 mL  30 mL Oral PRN Court Joy, PA-C      . benztropine (COGENTIN) tablet 1 mg  1 mg Oral BID Verne Spurr, PA-C   1 mg at 01/14/13 4098  . buPROPion [facility]) tablet 150 mg  150 mg Oral BID Verne Spurr, PA-C   150 mg at 01/14/13 1191  . chlordiazePOXIDE (LIBRIUM) capsule 25 mg  25 mg Oral TID PRN Rachael Fee, MD   25 mg at 01/14/13 0930  . chlorproMAZINE (THORAZINE) tablet 100 mg  100 mg Oral QHS Rachael Fee, MD   100 mg at 01/13/13 2130  . chlorproMAZINE (THORAZINE) tablet 50 mg  50 mg Oral TID Rachael Fee, MD   50 mg at 01/14/13 1141  . gabapentin (NEURONTIN) capsule 200 mg  200 mg Oral TID Rachael Fee, MD   200 mg at 01/14/13 1140  . hydrOXYzine (ATARAX/VISTARIL) tablet 50 mg  50 mg Oral QHS PRN,MR X 1 Court Joy, PA-C   50 mg at 01/13/13 2043  . ibuprofen (ADVIL,MOTRIN) tablet 600  mg  600 mg Oral Q8H PRN Court Joy, PA-C   600 mg at 01/14/13 1141  . magnesium hydroxide (MILK OF MAGNESIA) suspension 30 mL  30 mL Oral Daily PRN Court Joy, PA-C      . nicotine (NICODERM CQ - dosed in mg/24 hours) patch 21 mg  21 mg Transdermal Daily Himabindu Ravi, MD      . ondansetron (ZOFRAN) tablet 4 mg  4 mg Oral Q8H PRN Court Joy, PA-C        Lab Results: No results found for this or any previous visit (from the past 48 hour(s)).  Physical Findings: AIMS: Facial and Oral Movements Muscles of Facial Expression: None, normal Lips and Perioral Area: None, normal Jaw: None, normal Tongue: None, normal,Extremity Movements Upper  (arms, wrists, hands, fingers): None, normal Lower (legs, knees, ankles, toes): None, normal, Trunk Movements Neck, shoulders, hips: None, normal, Overall Severity Severity of abnormal movements (highest score from questions above): None, normal Incapacitation due to abnormal movements: None, normal Patient's awareness of abnormal movements (rate only patient's report): No Awareness, Dental Status Current problems with teeth and/or dentures?: No Does patient usually wear dentures?: No  CIWA:    COWS:     Treatment Plan Summary: Daily contact with patient to assess and evaluate symptoms and progress in treatment Medication management  Plan: Supportive approach/coping skills/relapse prevention           Get more information           Optimize treatment with the Thorazine the Neurontin           Thorazine 50 mg TID, 100 mg HS            Increase the Neurontin to 300 mg TID Medical Decision Making Problem Points:  Review of last therapy session (1) and Review of psycho-social stressors (1) Data Points:  Review of medication regiment & side effects (2) Review of new medications or change in dosage (2)  I certify that inpatient services furnished can reasonably be expected to improve the patient's condition.   Kelicia Youtz A 01/14/2013, 12:39 PM
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
## 2013-01-15 MED ORDER — METHOCARBAMOL 500 MG PO TABS
500.0000 mg | ORAL_TABLET | Freq: Three times a day (TID) | ORAL | Status: DC
  Administered 2013-01-15 – 2013-01-17 (×8): 500 mg via ORAL
  Filled 2013-01-15 (×12): qty 1

## 2013-01-15 MED ORDER — SIMETHICONE 80 MG PO CHEW
80.0000 mg | CHEWABLE_TABLET | Freq: Four times a day (QID) | ORAL | Status: DC
  Administered 2013-01-15 – 2013-01-17 (×9): 80 mg via ORAL
  Filled 2013-01-15 (×15): qty 1

## 2013-01-15 MED ORDER — TRAZODONE HCL 100 MG PO TABS
100.0000 mg | ORAL_TABLET | Freq: Every evening | ORAL | Status: DC | PRN
  Administered 2013-01-15: 100 mg via ORAL
  Filled 2013-01-15: qty 1

## 2013-01-15 MED ORDER — ENSURE COMPLETE PO LIQD
237.0000 mL | Freq: Two times a day (BID) | ORAL | Status: DC
  Administered 2013-01-16 (×2): 237 mL via ORAL

## 2013-01-15 MED ORDER — NABUMETONE 500 MG PO TABS
500.0000 mg | ORAL_TABLET | Freq: Two times a day (BID) | ORAL | Status: DC
  Administered 2013-01-15: 500 mg via ORAL
  Filled 2013-01-15 (×6): qty 1

## 2013-01-15 MED ORDER — CLONAZEPAM 1 MG PO TABS
1.0000 mg | ORAL_TABLET | Freq: Two times a day (BID) | ORAL | Status: DC
  Administered 2013-01-15 – 2013-01-17 (×5): 1 mg via ORAL
  Filled 2013-01-15 (×5): qty 1

## 2013-01-15 MED ORDER — IBUPROFEN 800 MG PO TABS
800.0000 mg | ORAL_TABLET | Freq: Three times a day (TID) | ORAL | Status: DC | PRN
  Administered 2013-01-15 – 2013-01-17 (×2): 800 mg via ORAL
  Filled 2013-01-15 (×2): qty 1

## 2013-01-15 NOTE — Progress Notes (Signed)
D:  Patient's self inventory sheet, patient sleeps well, has good appetite, low energy level, poor attention span.  Rated depression and hopelessness #5.   Anxiety #10.  Has experienced withdrawals of tremors, chilling, cravings in past 24 hours.  Denied SI.  Has experienced neck/shoulder/back pain.  Zero pain goal, worst pain #8.  After discharge, will have someone to help him.  Has seen visions and stuff, voices come here and do this.  Does have discharge plans.  Will stay at cousin and mom's home.  Not sure about medications. A:  Medications administered per MD orders.  Emotional support and encouragement given throughout day. R:  Denied SI and HI.  Stated he does see visions and stuff.  Voices say come here and do this.   Will continue 15 minutes checks for safety.  Safety maintained. Patient has been intrusive today.  Slept this afternoon and did not go to recreation, would not wake up for ensure this afternoon.
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
## 2013-01-15 NOTE — Tx Team (Signed)
Interdisciplinary Treatment Plan Update   Date Reviewed:  01/15/2013  Time Reviewed:  9:54 AM  Progress in Treatment:   Attending groups: Yes Participating in groups: Yes Taking medication as prescribed: Yes  Tolerating medication: Yes Family/Significant other contact made: Yes, contact made with fiance. Patient understands diagnosis: Yes  Discussing patient identified problems/goals with staff: Yes Medical problems stabilized or resolved: Yes Denies suicidal/homicidal ideation: Yes Patient has not harmed self or others: Yes  For review of initial/current patient goals, please see plan of care.  Estimated Length of Stay:  Discharge home today  Reasons for Continued Hospitalization:   New Problems/Goals identified:    Discharge Plan or Barriers:   Home with outpatient follow up [facility]  Additional Comments:  Patient no longer endorsing SI/HI.  He is rating depression at two and anxiety at three.  MD to make adjustment to Thorazine prior to discharge.  Attendees:  Patient:  Harold Brooks 01/15/2013 9:54 AM   Signature: Patrick North, MD 01/15/2013 9:54 AM  Signature:Tina Arlana Pouch, RN 01/15/2013 9:54 AM  Signature: Harold Barban, RN 01/15/2013 9:54 AM  Signature: 01/15/2013 9:54 AM  Signature:  01/15/2013 9:54 AM  Signature:  Juline Patch, LCSW 01/15/2013 9:54 AM  Signature: Silverio Decamp, PMH-NP 01/15/2013 9:54 AM  Signature:  Maseta Dorley,Care Coordinator 01/15/2013 9:54 AM  Signature: 01/15/2013 9:54 AM  Signature:    Signature:    Signature:      Scribe for Treatment Team:   Juline Patch,  01/15/2013 9:54 AM

## 2013-01-15 NOTE — Progress Notes (Signed)
[facility] MD Progress Note  01/15/2013 3:27 PM Harold Brooks  MRN:  [id number] Subjective:  Having a problem with anxiety. States that he was on klonopin prescribed by MD and that he is not planning to take him off. Does think that the Thorazine is helping. He still endorses that he has not been able to recover from his wife's suicide in front of him. States he images, flashes.  Diagnosis:  PTSD, Mood Disorder NOS, Cocaine Abuse  ADL's:  Intact  Sleep: Poor  Appetite:  Poor  Suicidal Ideation:  Plan:  denies Intent:  denies Means:  denies Homicidal Ideation:  Plan:  denies Intent:  denies Means:  denies AEB (as evidenced by):  Psychiatric Specialty Exam: Review of Systems  Constitutional: Negative.   HENT: Negative.   Eyes: Negative.   Respiratory: Negative.   Cardiovascular: Negative.   Gastrointestinal: Negative.   Genitourinary: Negative.   Musculoskeletal: Positive for back pain.  Skin: Negative.   Neurological: Negative.   Endo/Heme/Allergies: Negative.   Psychiatric/Behavioral: Positive for depression and suicidal ideas. The patient is nervous/anxious and has insomnia.     Blood pressure 92/60, pulse 106, temperature 97.6 F (36.4 C), temperature source Oral, resp. rate 18, height 5\' 11"  (1.803 m), weight 72.122 kg (159 lb).Body mass index is 22.19 kg/(m^2).  General Appearance: Fairly Groomed  Patent attorney::  Fair  Speech:  Clear and Coherent  Volume:  fluctuates  Mood:  Anxious, Depressed and Irritable  Affect:  Restricted  Thought Process:  Coherent and Goal Directed  Orientation:  Full (Time, Place, and Person)  Thought Content:  worries, concerns  Suicidal Thoughts:  Yes.  without intent/plan  Homicidal Thoughts:  No  Memory:  Immediate;   Fair Recent;   Fair Remote;   Fair  Judgement:  Fair  Insight:  Shallow  Psychomotor Activity:  Restlessness  Concentration:  Fair  Recall:  Fair  Akathisia:  No  Handed:  Right  AIMS (if indicated):     Assets:   Desire for Improvement  Sleep:  Number of Hours: 3.5   Current Medications: Current Facility-Administered Medications  Medication Dose Route Frequency Provider Last Rate Last Dose  . alum & mag hydroxide-simeth (MAALOX/MYLANTA) 200-200-20 MG/5ML suspension 30 mL  30 mL Oral PRN Court Joy, PA-C   30 mL at 01/14/13 2210  . benztropine (COGENTIN) tablet 1 mg  1 mg Oral BID Verne Spurr, PA-C   1 mg at 01/15/13 0846  . buPROPion (WELLBUTRIN XL) 24 hr tablet 300 mg  300 mg Oral Daily Rachael Fee, MD   300 mg at 01/15/13 0847  . chlorproMAZINE (THORAZINE) tablet 100 mg  100 mg Oral QHS Rachael Fee, MD   100 mg at 01/14/13 2120  . chlorproMAZINE (THORAZINE) tablet 50 mg  50 mg Oral TID Rachael Fee, MD   50 mg at 01/15/13 1151  . clonazePAM (KLONOPIN) tablet 1 mg  1 mg Oral BID Rachael Fee, MD   1 mg at 01/15/13 1008  . gabapentin (NEURONTIN) capsule 300 mg  300 mg Oral TID Rachael Fee, MD   300 mg at 01/15/13 1152  . hydrOXYzine (ATARAX/VISTARIL) tablet 50 mg  50 mg Oral QHS PRN,MR X 1 Court Joy, PA-C   50 mg at 01/14/13 2332  . ibuprofen (ADVIL,MOTRIN) tablet 800 mg  800 mg Oral Q8H PRN Rachael Fee, MD      . magnesium hydroxide (MILK OF MAGNESIA) suspension 30 mL  30 mL Oral Daily  PRN Court Joy, PA-C      . methocarbamol (ROBAXIN) tablet 500 mg  500 mg Oral TID Nanine Means, NP   500 mg at 01/15/13 1203  . nabumetone (RELAFEN) tablet 500 mg  500 mg Oral BID Nanine Means, NP   500 mg at 01/15/13 0933  . nicotine (NICODERM CQ - dosed in mg/24 hours) patch 21 mg  21 mg Transdermal Daily Himabindu Ravi, MD   21 mg at 01/15/13 0608  . ondansetron (ZOFRAN) tablet 4 mg  4 mg Oral Q8H PRN Court Joy, PA-C      . simethicone [facility]) chewable tablet 80 mg  80 mg Oral QID Nanine Means, NP        Lab Results: No results found for this or any previous visit (from the past 48 hour(s)).  Physical Findings: AIMS: Facial and Oral Movements Muscles of Facial Expression:  None, normal Lips and Perioral Area: None, normal Jaw: None, normal Tongue: None, normal,Extremity Movements Upper (arms, wrists, hands, fingers): None, normal Lower (legs, knees, ankles, toes): None, normal, Trunk Movements Neck, shoulders, hips: None, normal, Overall Severity Severity of abnormal movements (highest score from questions above): None, normal Incapacitation due to abnormal movements: None, normal Patient's awareness of abnormal movements (rate only patient's report): No Awareness, Dental Status Current problems with teeth and/or dentures?: No Does patient usually wear dentures?: No  CIWA:    COWS:     Treatment Plan Summary: Daily contact with patient to assess and evaluate symptoms and progress in treatment Medication management  Plan: Supportive approach/coping skills/anger management           Will resume the Klonopin 1 mg BID           Will optimize treatment with the Neurontin 300 mg TID            Trazodone 100 mg HS PRN sleep             Medical Decision Making Problem Points:  Review of last therapy session (1) and Review of psycho-social stressors (1) Data Points:  Review or order clinical lab tests (1) Review of medication regiment & side effects (2) Review of new medications or change in dosage (2)  I certify that inpatient services furnished can reasonably be expected to improve the patient's condition.   Jullian Clayson A 01/15/2013, 3:27 PM

## 2013-01-15 NOTE — Progress Notes (Signed)
Recreation Therapy Notes  Date: 05.19.2014 Time: 3:00pm Location: [address] Hall Dayroom  Group Topic/Focus: Stress Managment   Participation Level:  Minimal   Participation Quality:  Resistant, Labile  Affect:  Euthymic   Cognitive:  Appropriate   Additional Comments: Activity: Guided Imagery & Progressive Muscle Relaxation; Explanation: Guided Imagery - Patients listened to recorded script about a day at the beach. Progressive Muscle Relaxation - LRT instructed patients in how to complete progressive muscle relaxation.   Patient was initially resistant to participating in group activity. Patient stated he has done "this type of stuff" before and it did not work for him. LRT encouraged patient to stay for the group session to try the activity. Patient began listening to the Guided Imagery script, but complained of not being able to hear it. Then patient complained that it was going to put him to sleep. At approximately 3:20pm patient exited dayroom. Patient did not return.   Jearl Klinefelter, LRT/ CTRS  Cumming, Sian Joles L 01/15/2013 4:10 PM

## 2013-01-15 NOTE — Progress Notes (Signed)
Patient ID: Harold Brooks, male   DOB: 1966/03/20, 47 y.o.   MRN: [id number] D)  Has been out on the hall pacing at times, intrusive, asking the same questions several times, slow to process, c/o feeling anxious and irritable and asking about klonopin.  States he knows they make 2 mg klonopins and doesn't understand why he doesn't have it ordered.  Was given librium, vistaril, and thorazine tonight , and was medicated earlier with motrin for his shoulder pain and given heat packs when he went to bed.  Stated they helped him relax and decreased the pain last night, wanted to try them again tonight.  Also was given extra snacks and a large salad, was asking for food and drinks frequently tonight. Did attend group, watched tv in between times. A)  Will continue to monitor for safety, continue POC R)  Safety maintained.

## 2013-01-15 NOTE — [facility] Group Notes (Signed)
[facility] LCSW Aftercare Discharge Planning Group Note   01/15/2013 4:23 PM  Participation Quality:  Appropriate  Mood/Affect:  Appropriate and Depressed  Depression Rating:  5  Anxiety Rating:  10  Thoughts of Suicide:  No  Will you contract for safety?   NA  Current AVH:  Yes  Plan for Discharge/Comments:  Patient advised of admitting to hospital with HI and SI.  He currently denies both.  Patient endorse using at least $100 per day in cocaine.  He is plans to return home to live with either his mother or cousin.  He is followed outpatient by [facility] in [place].  Transportation Means: Patient will need assistance with transportation.  Supports:  Patient has limited support system.   Kameka Whan, Joesph July

## 2013-01-15 NOTE — [facility] Group Notes (Signed)
[facility] LCSW Group Therapy        Overcoming Obstacles 1:15 2:30 PM         01/15/2013 4:21 PM  Type of Therapy:  Group Therapy  Participation Level:  Active  Participation Quality:  Appropriate  Affect:  Appropriate and Depressed  Cognitive:  Appropriate  Insight:  Developing/Improving and Engaged  Engagement in Therapy:  Developing/Improving and Engaged  Modes of Intervention:  Discussion, Education, Exploration, Problem-Solving, Rapport Building, Support  Summary of Progress/Problems:  Patient shared the obstacle he has to overcome is fear of being accepted by others.  He shared he has spend a quarter of his life in prison and does not fit in well with others.  Patient shared he is trying to take one day at a time.  Wynn Banker 01/15/2013, 4:21 PM

## 2013-01-16 LAB — GC/CHLAMYDIA PROBE AMP
CT Probe RNA: NEGATIVE
GC Probe RNA: NEGATIVE

## 2013-01-16 LAB — RPR: RPR Ser Ql: NONREACTIVE

## 2013-01-16 NOTE — Progress Notes (Signed)
Psychoeducational Group Note  Date:  01/16/2013 Time:  1100  Group Topic/Focus:  Recovery Goals:   The focus of this group is to identify appropriate goals for recovery and establish a plan to achieve them.  Participation Level: Did Not Attend   Additional Comments:  Patient did not attend group, patient remained in bed.  Karleen Hampshire Brittini 01/16/2013, 7:13 PM

## 2013-01-16 NOTE — Progress Notes (Signed)
D:  Patient's self inventory sheet, patient has fair sleep, good appetite, low energy level, improving attention span.  Rated depression #4, hopelessness #8, anxiety #6.  Has experienced tremors, chilling.  Denied SI.  Has felt lightheaded, pain in past 24 hours.  Pain goal #7, worst pain #7.  Plans to go to meetings after discharge.  No questions for staff.  No discharge plans.  No problems taking meds aafter discharge.  "But somedays just don't want to get out bed."    A:  Medications administered per MD order.  Continually asking about medications. R:  Denied SI and HI.  Denied A/V hallucinations.  Will continue 15 minute checks for safety, safety maintained.   Attended first group this morning.

## 2013-01-16 NOTE — Progress Notes (Signed)
D: Patient in the day room at the beginning of the shift. He came out of the day room and staggered; seemed as if he was going to fall.  Staff followed patient to his room. He appeared drowsy, irritable and kept asking for more medications.  Patient stated; " I'm not just gona sit here and do nothing, there is no TV in the room, what am I suppose to be doing" A: Writer offered patient a large cup of Gatorade and encouraged him to lay in his bed. Writer notified the [facility] and PA about holding patient's  HS medications since he appeared over sedated. PA  Authorized holding HS medications Staff  to check patient V/S. R: Patient attended group. Irritable and did not listen to directions. Will continue to monitor.

## 2013-01-16 NOTE — [facility] Group Notes (Signed)
[facility] LCSW Group Therapy      Feelings About Diagnosis 1:15 - 2:30 PM          01/16/2013 4:24 PM  Type of Therapy:  Group Therapy  Participation Level:  Minimal  Participation Quality:  Intrusive and Redirectable  Affect:  Irritable  Cognitive:  Lacking  Insight:  Distracting  Engagement in Therapy:  Distracting  Modes of Intervention:  Discussion, Education, Exploration, Problem-Solving, Rapport Building, Support  Summary of Progress/Problems:  Patient had to be redirected for having side conversation.  He was disruptive and left group early.  Wynn Banker 01/16/2013, 4:24 PM

## 2013-01-16 NOTE — Progress Notes (Signed)
[facility] MD Progress Note  01/16/2013 6:41 PM Harold Brooks  MRN:  [id number] Subjective:  Harold Brooks is sleeping better. He is having some sedation during the day but overall he feels he can work with these medications. Relieved to know that his test does not show evidence of STD's. He is still endorsing anxiety, but is willing to discuss with his MD any possible increase in his Klonopin Diagnosis:  PTSD, Cocaine abuse, Unspecified Mood Disorder  ADL's:  Intact  Sleep: Fair  Appetite:  Fair  Suicidal Ideation:  Plan:  denies Intent:  denies Means:  denies Homicidal Ideation:  Plan:  denies Intent:  denies Means:  denies AEB (as evidenced by):  Psychiatric Specialty Exam: Review of Systems  Constitutional: Negative.   HENT: Negative.   Eyes: Negative.   Respiratory: Negative.   Cardiovascular: Negative.   Gastrointestinal: Negative.   Genitourinary: Negative.   Musculoskeletal: Negative.   Skin: Negative.   Neurological: Negative.   Endo/Heme/Allergies: Negative.   Psychiatric/Behavioral: Positive for depression and substance abuse. The patient is nervous/anxious.     Blood pressure 113/76, pulse 98, temperature 98 F (36.7 C), temperature source Oral, resp. rate 20, height 5\' 11"  (1.803 m), weight 72.122 kg (159 lb).Body mass index is 22.19 kg/(m^2).  General Appearance: Fairly Groomed  Patent attorney::  Fair  Speech:  Clear and Coherent and Slow  Volume:  Decreased  Mood:  Anxious  Affect:  Restricted  Thought Process:  Coherent and Goal Directed  Orientation:  Full (Time, Place, and Person)  Thought Content:  somatically focused, worries, concerns  Suicidal Thoughts:  No  Homicidal Thoughts:  No  Memory:  Immediate;   Fair Recent;   Fair Remote;   Fair  Judgement:  Fair  Insight:  Shallow  Psychomotor Activity:  Restlessness  Concentration:  Fair  Recall:  Fair  Akathisia:  No  Handed:  Right  AIMS (if indicated):     Assets:  Desire for Improvement  Sleep:   Number of Hours: 5   Current Medications: Current Facility-Administered Medications  Medication Dose Route Frequency Provider Last Rate Last Dose  . alum & mag hydroxide-simeth (MAALOX/MYLANTA) 200-200-20 MG/5ML suspension 30 mL  30 mL Oral PRN Court Joy, PA-C   30 mL at 01/14/13 2210  . benztropine (COGENTIN) tablet 1 mg  1 mg Oral BID Verne Spurr, PA-C   1 mg at 01/16/13 1645  . buPROPion (WELLBUTRIN XL) 24 hr tablet 300 mg  300 mg Oral Daily Rachael Fee, MD   300 mg at 01/16/13 0820  . chlorproMAZINE (THORAZINE) tablet 100 mg  100 mg Oral QHS Rachael Fee, MD   100 mg at 01/15/13 2200  . chlorproMAZINE (THORAZINE) tablet 50 mg  50 mg Oral TID Rachael Fee, MD   50 mg at 01/16/13 1645  . clonazePAM (KLONOPIN) tablet 1 mg  1 mg Oral BID Rachael Fee, MD   1 mg at 01/16/13 1645  . feeding supplement (ENSURE COMPLETE) liquid 237 mL  237 mL Oral BID BM Rachael Fee, MD   237 mL at 01/16/13 1308  . gabapentin (NEURONTIN) capsule 300 mg  300 mg Oral TID Rachael Fee, MD   300 mg at 01/16/13 1646  . hydrOXYzine (ATARAX/VISTARIL) tablet 50 mg  50 mg Oral QHS PRN,MR X 1 Court Joy, PA-C   50 mg at 01/14/13 2332  . ibuprofen (ADVIL,MOTRIN) tablet 800 mg  800 mg Oral Q8H PRN Rachael Fee, MD  800 mg at 01/15/13 2205  . magnesium hydroxide (MILK OF MAGNESIA) suspension 30 mL  30 mL Oral Daily PRN Court Joy, PA-C      . methocarbamol (ROBAXIN) tablet 500 mg  500 mg Oral TID Nanine Means, NP   500 mg at 01/16/13 1646  . nicotine (NICODERM CQ - dosed in mg/24 hours) patch 21 mg  21 mg Transdermal Daily Himabindu Ravi, MD   21 mg at 01/16/13 0818  . ondansetron (ZOFRAN) tablet 4 mg  4 mg Oral Q8H PRN Court Joy, PA-C      . simethicone [facility]) chewable tablet 80 mg  80 mg Oral QID Nanine Means, NP   80 mg at 01/16/13 1646  . traZODone (DESYREL) tablet 100 mg  100 mg Oral QHS PRN Rachael Fee, MD   100 mg at 01/15/13 2317    Lab Results:    Physical Findings: AIMS:  Facial and Oral Movements Muscles of Facial Expression: None, normal Lips and Perioral Area: None, normal Jaw: None, normal Tongue: None, normal,Extremity Movements Upper (arms, wrists, hands, fingers): None, normal Lower (legs, knees, ankles, toes): None, normal, Trunk Movements Neck, shoulders, hips: None, normal, Overall Severity Severity of abnormal movements (highest score from questions above): None, normal Incapacitation due to abnormal movements: None, normal Patient's awareness of abnormal movements (rate only patient's report): No Awareness, Dental Status Current problems with teeth and/or dentures?: No Does patient usually wear dentures?: No  CIWA:  CIWA-Ar Total: 5 COWS:  COWS Total Score: 4  Treatment Plan Summary: Daily contact with patient to assess and evaluate symptoms and progress in treatment Medication management  Plan: Supportive approach/coping skills/relapse prevention           Optimize treatment with psychotropics           Consider discharge in the next 24-48 hours  Medical Decision Making Problem Points:  Review of last therapy session (1) and Review of psycho-social stressors (1) Data Points:  Review or order clinical lab tests (1) Review of medication regiment & side effects (2)  I certify that inpatient services furnished can reasonably be expected to improve the patient's condition.   Korina Tretter A 01/16/2013, 6:41 PM

## 2013-01-16 NOTE — Progress Notes (Signed)
Grief & Loss Group  The group discussed significant losses in their lives. Group members processed feelings related to grief and loss. Group members discussed self-love and respect for themselves, and the struggles and difficulties it took for them to get there.    Pt made a few comments as group was starting about how talking about loss would be hard for him and he didn't want to start crying. He left shortly after he made that comment. After group, the patient explained that he had left the group because he was not feeling well and did not want to hurt the facilitators feelings.  Sherol Dade Counselor Intern Haroldine Laws

## 2013-01-16 NOTE — Progress Notes (Signed)
D: Patient appeared drugy at the beginning of this shift, although he continued asking for more medications and would not agree to lay down in his bed. He denied SI/HI and denied hallucinations. He attended group. After receiving his bedtime medication he continued asking for more medications for pain and sleep. A: Writer encouraged patient to stay in his room to prevent falling.   R: Patient refused to stay  In bed until he received Trazodone for sleep.  Q 15 minute check continues as ordered to maintain safety.

## 2013-01-16 NOTE — Progress Notes (Signed)
Adult Psychoeducational Group Note  Date:  01/16/2013 Time:  10:25 PM  Group Topic/Focus:  Wrap-Up Group:   The focus of this group is to help patients review their daily goal of treatment and discuss progress on daily workbooks.  Participation Level:  Minimal  Participation Quality:  Drowsy  Affect:  Lethargic  Cognitive:  Disorganized and Lacking  Insight: Lacking  Engagement in Group:  Poor  Modes of Intervention:  Exploration and Support  Additional Comments:  Pt unable to participate in group.  Panzy Bubeck, Randal Buba 01/16/2013, 10:25 PM

## 2013-01-17 MED ORDER — CLONAZEPAM 1 MG PO TABS: 0.5000 mg | ORAL_TABLET | Freq: Two times a day (BID) | ORAL | Status: DC

## 2013-01-17 MED ORDER — TRAZODONE HCL 100 MG PO TABS: 100.0000 mg | ORAL_TABLET | Freq: Every evening | ORAL | Status: DC | PRN

## 2013-01-17 MED ORDER — CHLORPROMAZINE HCL 50 MG PO TABS: 50.0000 mg | ORAL_TABLET | Freq: Two times a day (BID) | ORAL | Status: DC

## 2013-01-17 MED ORDER — GABAPENTIN 300 MG PO CAPS: 300.0000 mg | ORAL_CAPSULE | Freq: Three times a day (TID) | ORAL | Status: DC

## 2013-01-17 MED ORDER — PANTOPRAZOLE SODIUM 40 MG PO TBEC: 40.0000 mg | DELAYED_RELEASE_TABLET | Freq: Every day | ORAL | Status: DC

## 2013-01-17 MED ORDER — MENTHOL 3 MG MT LOZG
1.0000 | LOZENGE | OROMUCOSAL | Status: DC | PRN
  Administered 2013-01-17 (×2): 3 mg via ORAL

## 2013-01-17 MED ORDER — PANTOPRAZOLE SODIUM 40 MG PO TBEC
40.0000 mg | DELAYED_RELEASE_TABLET | Freq: Every day | ORAL | Status: DC
  Administered 2013-01-17: 40 mg via ORAL
  Filled 2013-01-17 (×4): qty 1

## 2013-01-17 MED ORDER — BENZTROPINE MESYLATE 1 MG PO TABS: 1.0000 mg | ORAL_TABLET | Freq: Two times a day (BID) | ORAL | Status: DC

## 2013-01-17 MED ORDER — CHLORPROMAZINE HCL 50 MG PO TABS
50.0000 mg | ORAL_TABLET | Freq: Two times a day (BID) | ORAL | Status: DC
  Filled 2013-01-17 (×4): qty 1

## 2013-01-17 MED ORDER — CLONAZEPAM 0.5 MG PO TABS
0.5000 mg | ORAL_TABLET | Freq: Two times a day (BID) | ORAL | Status: DC
  Administered 2013-01-17: 0.5 mg via ORAL
  Filled 2013-01-17: qty 1

## 2013-01-17 MED ORDER — METHOCARBAMOL 500 MG PO TABS: 500.0000 mg | ORAL_TABLET | Freq: Three times a day (TID) | ORAL | Status: DC

## 2013-01-17 MED ORDER — CLONAZEPAM 0.5 MG PO TABS: 1.0000 mg | ORAL_TABLET | Freq: Two times a day (BID) | ORAL | Status: DC

## 2013-01-17 MED ORDER — BUPROPION HCL ER (XL) 300 MG PO TB24: 300.0000 mg | ORAL_TABLET | Freq: Every day | ORAL | Status: DC

## 2013-01-17 MED ORDER — CLONAZEPAM 0.5 MG PO TABS: 0.5000 mg | ORAL_TABLET | Freq: Two times a day (BID) | ORAL | Status: DC

## 2013-01-17 NOTE — Progress Notes (Signed)
[facility] Adult Case Management Discharge Plan :  Will you be returning to the same living situation after discharge: Yes,  Paitent to return to his mother's home. At discharge, do you have transportation home?:Yes,  Patient to arrange his transportation home. Do you have the ability to pay for your medications:Yes,  Patient is able to obtain medications.  Release of information consent forms completed and in the chart;  Patient's signature needed at discharge.  Patient to Follow up at: Follow-up Information   Follow up with Dr. Stevphen Rochester - [facility] On 01/23/2013. (Tuesday, Jan 23, 2013 at 1:45 PM)    Contact information:   [address] [place], [place]   [address]  [phone]      Patient denies SI/HI:   Patient no longer endorsing SI/HI or other thoughts of self harm.     Safety Planning and Suicide Prevention discussed:  .Reviewed with all patients during discharge planning group   Montgomery Rothlisberger, Joesph July 01/17/2013, 10:29 AM

## 2013-01-17 NOTE — Progress Notes (Signed)
Patient ID: Harold Brooks, male   DOB: 07-Feb-1966, 47 y.o.   MRN: [id number] Patient discharged per physician order; patient denies SI/HI and A/V hallucinations; patient has prescriptions and copy of AVS after it was reviewed with him; patient has no other concerns at this time; patient is in no acute distress at this time; patient left the unit ambulatory, clear, alert, and oriented

## 2013-01-17 NOTE — Plan of Care (Signed)
Problem: Ineffective individual coping Goal: STG: Patient will participate in after care plan Outcome: Completed/Met Date Met:  01/17/13 Patient has attended groups and talked about problems related to his admission.  Horace Porteous Marvetta Vohs, LCSW 01/17/2013

## 2013-01-17 NOTE — Progress Notes (Signed)
Recreation Therapy Notes  Date: 05.21.2014  Time: 3:00pm  Location: [address] Day[address]   Group Topic/Focus: Leisure Education   Participation Level:  Did not attend  [employer], LRT/CTRS  Jearl Klinefelter 01/17/2013 4:23 PM

## 2013-01-17 NOTE — Plan of Care (Signed)
Problem: Alteration in mood & ability to function due to Goal: LTG-Patient demonstrates decreased signs of withdrawal (Patient demonstrates decreased signs of withdrawal to the point the patient is safe to return home and continue treatment in an outpatient setting)  Outcome: Completed/Met Date Met:  01/17/13 Patient is not endorsing any symptoms of withdrawal.  .Harold Basnett, LCSW 01/17/2013

## 2013-01-17 NOTE — [facility] Suicide Risk Assessment (Signed)
Suicide Risk Assessment  Discharge Assessment     Demographic Factors:  Male, Caucasian, Low socioeconomic status, Living alone and Unemployed  Mental Status Per Nursing Assessment::   On Admission:  Self-harm thoughts  Current Mental Status by Physician: Alert and oriented to 4. Denies AH/VH/SI/HI.  Loss Factors: Decline in physical health and Financial problems/change in socioeconomic status  Historical Factors: Family history of mental illness or substance abuse and Impulsivity  Risk Reduction Factors:   Positive social support and Positive coping skills or problem solving skills  Continued Clinical Symptoms:  Alcohol/Substance Abuse/Dependencies  Cognitive Features That Contribute To Risk:  Closed-mindedness Thought constriction (tunnel vision)    Suicide Risk:  Minimal: No identifiable suicidal ideation.  Patients presenting with no risk factors but with morbid ruminations; may be classified as minimal risk based on the severity of the depressive symptoms  Discharge Diagnoses:   AXIS I:  Polysubstance dependence AXIS II:  Deferred AXIS III:   Past Medical History  Diagnosis Date  . Kidney stones    AXIS IV:  economic problems and other psychosocial or environmental problems AXIS V:  61-70 mild symptoms  Plan Of Care/Follow-up recommendations:  Activity:  as tolerated Diet:  regular Follow up with outpatient appointments.  Is patient on multiple antipsychotic therapies at discharge:  No   Has Patient had three or more failed trials of antipsychotic monotherapy by history:  No  Recommended Plan for Multiple Antipsychotic Therapies: NA  Harold Brooks 01/17/2013, 10:33 AM

## 2013-01-17 NOTE — [facility] Group Notes (Signed)
[facility] LCSW Aftercare Discharge Planning Group Note   01/17/2013 10:31 AM  Participation Quality:  Appropriate  Mood/Affect:  Appropriate  Depression Rating:  1-2  Anxiety Rating:  5  Thoughts of Suicide:  No Will you contract for safety?   NA  Current AVH:  No  Plan for Discharge/Comments:  Patient reports being much better and wants to discharge home today.  He reports his girlfriend will pick him up for discharge.  Patient to follow up with [facility].  Transportation Means: Patient has transportation.   Supports:  Patient has limited support system.   Nilay Mangrum, Joesph July

## 2013-01-17 NOTE — Plan of Care (Signed)
Problem: Alteration in mood Goal: LTG-Pt's behavior demonstrates decreased signs of depression (Patient's behavior demonstrates decreased signs of depression to the point the patient is safe to return home and continue treatment in an outpatient setting)  Outcome: Completed/Met Date Met:  01/17/13 Patient is rating depression at one/two and has talked about problems related to admission.  His outpatient follow up is scheduled.  Harold Porteous [name], LCSW 01/17/2013

## 2013-01-17 NOTE — [facility] Suicide Risk Assessment (Signed)
[facility] INPATIENT:  Family/Significant Other Suicide Prevention Education  Suicide Prevention Education:  Education Completed; Jeanell Sparrow, Fiance, [phone];  has been identified by the patient as the family member/significant other with whom the patient will be residing, and identified as the person(s) who will aid the patient in the event of a mental health crisis (suicidal ideations/suicide attempt).  With written consent from the patient, the family member/significant other has been provided the following suicide prevention education, prior to the and/or following the discharge of the patient.  The suicide prevention education provided includes the following:  Suicide risk factors  Suicide prevention and interventions  National Suicide Hotline telephone number  [facility] assessment telephone number  [facility] Emergency Assistance 911  [facility] and/or Residential Mobile Crisis Unit telephone number  Request made of family/significant other to:  Remove weapons (e.g., guns, rifles, knives), all items previously/currently identified as safety concern.  Fiance reports there are no guns in the home.  Remove drugs/medications (over-the-counter, prescriptions, illicit drugs), all items previously/currently identified as a safety concern.  The family member/significant other verbalizes understanding of the suicide prevention education information provided.  The family member/significant other agrees to remove the items of safety concern listed above.  Wynn Banker 01/17/2013, 12:41 PM
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
## 2013-01-19 NOTE — Progress Notes (Signed)
Patient Discharge Instructions:  After Visit Summary (AVS):   Faxed to:  01/19/13 Discharge Summary Note:   Faxed to:  01/19/13 Psychiatric Admission Assessment Note:   Faxed to:  01/19/13 Suicide Risk Assessment - Discharge Assessment:   Faxed to:  01/19/13 Faxed/Sent to the Next Level Care provider:  01/19/13 Faxed to [facility] @ [phone]  Jerelene Redden, 01/19/2013, 2:48 PM

## 2013-12-31 ENCOUNTER — Emergency Department: Payer: Self-pay | Admitting: Emergency Medicine

## 2013-12-31 LAB — COMPREHENSIVE METABOLIC PANEL
ALK PHOS: 83 U/L
ALT: 18 U/L (ref 12–78)
Albumin: 4.2 g/dL (ref 3.4–5.0)
Anion Gap: 7 (ref 7–16)
BUN: 12 mg/dL (ref 7–18)
Bilirubin,Total: 0.4 mg/dL (ref 0.2–1.0)
Calcium, Total: 8.4 mg/dL — ABNORMAL LOW (ref 8.5–10.1)
Chloride: 106 mmol/L (ref 98–107)
Co2: 25 mmol/L (ref 21–32)
Creatinine: 1.26 mg/dL (ref 0.60–1.30)
Glucose: 108 mg/dL — ABNORMAL HIGH (ref 65–99)
Osmolality: 276 (ref 275–301)
POTASSIUM: 3 mmol/L — AB (ref 3.5–5.1)
SGOT(AST): 20 U/L (ref 15–37)
Sodium: 138 mmol/L (ref 136–145)
Total Protein: 7.6 g/dL (ref 6.4–8.2)

## 2013-12-31 LAB — DRUG SCREEN, URINE
Amphetamines, Ur Screen: NEGATIVE (ref ?–1000)
Barbiturates, Ur Screen: NEGATIVE (ref ?–200)
Benzodiazepine, Ur Scrn: NEGATIVE (ref ?–200)
COCAINE METABOLITE, UR ~~LOC~~: POSITIVE (ref ?–300)
Cannabinoid 50 Ng, Ur ~~LOC~~: POSITIVE (ref ?–50)
MDMA (Ecstasy)Ur Screen: NEGATIVE (ref ?–500)
Methadone, Ur Screen: NEGATIVE (ref ?–300)
Opiate, Ur Screen: NEGATIVE (ref ?–300)
Phencyclidine (PCP) Ur S: NEGATIVE (ref ?–25)
TRICYCLIC, UR SCREEN: NEGATIVE (ref ?–1000)

## 2013-12-31 LAB — CBC
HCT: 41 % (ref 40.0–52.0)
HGB: 14.2 g/dL (ref 13.0–18.0)
MCH: 32.4 pg (ref 26.0–34.0)
MCHC: 34.6 g/dL (ref 32.0–36.0)
MCV: 94 fL (ref 80–100)
Platelet: 244 10*3/uL (ref 150–440)
RBC: 4.37 10*6/uL — ABNORMAL LOW (ref 4.40–5.90)
RDW: 12.5 % (ref 11.5–14.5)
WBC: 8.3 10*3/uL (ref 3.8–10.6)

## 2013-12-31 LAB — ETHANOL
Ethanol %: <0.003 % (ref 0.000–0.080)
Ethanol: <3 mg/dL

## 2013-12-31 LAB — URINALYSIS, COMPLETE
BILIRUBIN, UR: NEGATIVE
Bacteria: NONE SEEN
Blood: NEGATIVE
Glucose,UR: NEGATIVE mg/dL (ref 0–75)
Hyaline Cast: 1
Leukocyte Esterase: NEGATIVE
Nitrite: NEGATIVE
PH: 5 (ref 4.5–8.0)
RBC,UR: 2 /HPF (ref 0–5)
SQUAMOUS EPITHELIAL: NONE SEEN
Specific Gravity: 1.032 (ref 1.003–1.030)
WBC UR: 2 /HPF (ref 0–5)

## 2013-12-31 LAB — ACETAMINOPHEN LEVEL: Acetaminophen: <2 ug/mL

## 2013-12-31 LAB — SALICYLATE LEVEL: Salicylates, Serum: 3.5 mg/dL — ABNORMAL HIGH

## 2013-12-31 LAB — TROPONIN I
Troponin-I: <0.02 ng/mL
Troponin-I: <0.02 ng/mL

## 2014-12-04 ENCOUNTER — Encounter (HOSPITAL_COMMUNITY): Payer: Self-pay | Admitting: *Deleted

## 2014-12-04 ENCOUNTER — Emergency Department (HOSPITAL_COMMUNITY)
Admission: EM | Admit: 2014-12-04 | Discharge: 2014-12-04 | Disposition: A | Discharge status: Other Acute Inpatient Hospital | Payer: Medicaid Other | Source: Home / Self Care | Attending: Emergency Medicine | Admitting: Emergency Medicine

## 2014-12-04 ENCOUNTER — Inpatient Hospital Stay (HOSPITAL_COMMUNITY)
Admission: EM | Admit: 2014-12-04 | Discharge: 2014-12-09 | DRG: 885 | Disposition: A | Discharge status: Home / Self Care | Payer: Medicaid Other | Source: Intra-hospital | Attending: Psychiatry | Admitting: Psychiatry

## 2014-12-04 DIAGNOSIS — F1320 Sedative, hypnotic or anxiolytic dependence, uncomplicated: Secondary | ICD-10-CM | POA: Diagnosis present

## 2014-12-04 DIAGNOSIS — F4310 Post-traumatic stress disorder, unspecified: Secondary | ICD-10-CM | POA: Diagnosis not present

## 2014-12-04 DIAGNOSIS — Z87442 Personal history of urinary calculi: Secondary | ICD-10-CM | POA: Insufficient documentation

## 2014-12-04 DIAGNOSIS — F410 Panic disorder [episodic paroxysmal anxiety] without agoraphobia: Secondary | ICD-10-CM | POA: Diagnosis present

## 2014-12-04 DIAGNOSIS — F515 Nightmare disorder: Secondary | ICD-10-CM | POA: Diagnosis present

## 2014-12-04 DIAGNOSIS — F319 Bipolar disorder, unspecified: Secondary | ICD-10-CM | POA: Diagnosis not present

## 2014-12-04 DIAGNOSIS — R45851 Suicidal ideations: Secondary | ICD-10-CM

## 2014-12-04 DIAGNOSIS — G4700 Insomnia, unspecified: Secondary | ICD-10-CM | POA: Diagnosis present

## 2014-12-04 DIAGNOSIS — F209 Schizophrenia, unspecified: Secondary | ICD-10-CM | POA: Diagnosis present

## 2014-12-04 DIAGNOSIS — F29 Unspecified psychosis not due to a substance or known physiological condition: Secondary | ICD-10-CM | POA: Diagnosis not present

## 2014-12-04 DIAGNOSIS — Z79899 Other long term (current) drug therapy: Secondary | ICD-10-CM | POA: Insufficient documentation

## 2014-12-04 DIAGNOSIS — F1410 Cocaine abuse, uncomplicated: Secondary | ICD-10-CM | POA: Diagnosis not present

## 2014-12-04 DIAGNOSIS — F1994 Other psychoactive substance use, unspecified with psychoactive substance-induced mood disorder: Secondary | ICD-10-CM | POA: Diagnosis present

## 2014-12-04 DIAGNOSIS — F1420 Cocaine dependence, uncomplicated: Secondary | ICD-10-CM | POA: Diagnosis present

## 2014-12-04 DIAGNOSIS — F3189 Other bipolar disorder: Secondary | ICD-10-CM | POA: Diagnosis not present

## 2014-12-04 DIAGNOSIS — F17210 Nicotine dependence, cigarettes, uncomplicated: Secondary | ICD-10-CM | POA: Diagnosis present

## 2014-12-04 DIAGNOSIS — Z720 Tobacco use: Secondary | ICD-10-CM | POA: Insufficient documentation

## 2014-12-04 HISTORY — DX: Schizophrenia, unspecified: F20.9

## 2014-12-04 LAB — CBC
HEMATOCRIT: 43.8 % (ref 39.0–52.0)
HEMOGLOBIN: 15.2 g/dL (ref 13.0–17.0)
MCH: 33.1 pg (ref 26.0–34.0)
MCHC: 34.7 g/dL (ref 30.0–36.0)
MCV: 95.4 fL (ref 78.0–100.0)
PLATELETS: 288 10*3/uL (ref 150–400)
RBC: 4.59 MIL/uL (ref 4.22–5.81)
RDW: 13.1 % (ref 11.5–15.5)
WBC: 6.2 10*3/uL (ref 4.0–10.5)

## 2014-12-04 LAB — COMPREHENSIVE METABOLIC PANEL
ALT: 13 U/L (ref 0–53)
AST: 17 U/L (ref 0–37)
Albumin: 4.2 g/dL (ref 3.5–5.2)
Alkaline Phosphatase: 68 U/L (ref 39–117)
Anion gap: 9 (ref 5–15)
BILIRUBIN TOTAL: 0.3 mg/dL (ref 0.3–1.2)
BUN: 7 mg/dL (ref 6–23)
CALCIUM: 8.7 mg/dL (ref 8.4–10.5)
CHLORIDE: 108 mmol/L (ref 96–112)
CO2: 23 mmol/L (ref 19–32)
CREATININE: 0.98 mg/dL (ref 0.50–1.35)
GLUCOSE: 136 mg/dL — AB (ref 70–99)
Potassium: 3.8 mmol/L (ref 3.5–5.1)
Sodium: 140 mmol/L (ref 135–145)
Total Protein: 7 g/dL (ref 6.0–8.3)

## 2014-12-04 LAB — I-STAT TROPONIN, ED: TROPONIN I, POC: 0 ng/mL (ref 0.00–0.08)

## 2014-12-04 LAB — ETHANOL: Alcohol, Ethyl (B): <5 mg/dL (ref 0–9)

## 2014-12-04 LAB — RAPID URINE DRUG SCREEN, HOSP PERFORMED
Amphetamines: NOT DETECTED
BARBITURATES: NOT DETECTED
Benzodiazepines: NOT DETECTED
COCAINE: POSITIVE — AB
Opiates: NOT DETECTED
TETRAHYDROCANNABINOL: POSITIVE — AB

## 2014-12-04 LAB — ACETAMINOPHEN LEVEL

## 2014-12-04 LAB — SALICYLATE LEVEL: Salicylate Lvl: <4 mg/dL (ref 2.8–20.0)

## 2014-12-04 MED ORDER — CHLORPROMAZINE HCL 25 MG PO TABS
100.0000 mg | ORAL_TABLET | Freq: Three times a day (TID) | ORAL | Status: DC
  Administered 2014-12-04 (×2): 100 mg via ORAL
  Filled 2014-12-04 (×2): qty 4

## 2014-12-04 MED ORDER — ALUM & MAG HYDROXIDE-SIMETH 200-200-20 MG/5ML PO SUSP: 30.0000 mL | ORAL | Status: DC | PRN

## 2014-12-04 MED ORDER — ZOLPIDEM TARTRATE 5 MG PO TABS: 5.0000 mg | ORAL_TABLET | Freq: Every evening | ORAL | Status: DC | PRN

## 2014-12-04 MED ORDER — NICOTINE 21 MG/24HR TD PT24
21.0000 mg | MEDICATED_PATCH | Freq: Every day | TRANSDERMAL | Status: DC
  Administered 2014-12-04: 21 mg via TRANSDERMAL
  Filled 2014-12-04: qty 1

## 2014-12-04 MED ORDER — GABAPENTIN 300 MG PO CAPS
300.0000 mg | ORAL_CAPSULE | Freq: Three times a day (TID) | ORAL | Status: DC
  Administered 2014-12-04 (×2): 300 mg via ORAL
  Filled 2014-12-04 (×2): qty 1

## 2014-12-04 MED ORDER — CLONAZEPAM 1 MG PO TABS
1.0000 mg | ORAL_TABLET | Freq: Two times a day (BID) | ORAL | Status: DC
  Administered 2014-12-04: 1 mg via ORAL
  Filled 2014-12-04: qty 1

## 2014-12-04 MED ORDER — PANTOPRAZOLE SODIUM 40 MG PO TBEC
40.0000 mg | DELAYED_RELEASE_TABLET | Freq: Every day | ORAL | Status: DC
  Administered 2014-12-04: 40 mg via ORAL
  Filled 2014-12-04: qty 1

## 2014-12-04 MED ORDER — QUETIAPINE FUMARATE ER 300 MG PO TB24
300.0000 mg | ORAL_TABLET | Freq: Every evening | ORAL | Status: DC
  Administered 2014-12-04: 300 mg via ORAL
  Filled 2014-12-04: qty 1

## 2014-12-04 MED ORDER — IBUPROFEN 200 MG PO TABS: 600.0000 mg | ORAL_TABLET | Freq: Three times a day (TID) | ORAL | Status: DC | PRN

## 2014-12-04 MED ORDER — ACETAMINOPHEN 325 MG PO TABS: 650.0000 mg | ORAL_TABLET | ORAL | Status: DC | PRN

## 2014-12-04 MED ORDER — BUPROPION HCL ER (XL) 300 MG PO TB24
300.0000 mg | ORAL_TABLET | Freq: Every day | ORAL | Status: DC
  Filled 2014-12-04: qty 1

## 2014-12-04 MED ORDER — ONDANSETRON HCL 4 MG PO TABS: 4.0000 mg | ORAL_TABLET | Freq: Three times a day (TID) | ORAL | Status: DC | PRN

## 2014-12-04 MED ORDER — BENZTROPINE MESYLATE 1 MG PO TABS
1.0000 mg | ORAL_TABLET | Freq: Two times a day (BID) | ORAL | Status: DC
  Administered 2014-12-04: 1 mg via ORAL
  Filled 2014-12-04: qty 1

## 2014-12-04 NOTE — ED Notes (Signed)
Pt transported to [facility] by [facility] transportation service for continuation of specialized care. Belongings given to driver after patient signed for them. Pt left in no acute distress. 
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
## 2014-12-04 NOTE — ED Notes (Signed)
Patient states he is "schizophrenic". Desires long term drug/mental health treatment.  Acuity low.

## 2014-12-04 NOTE — ED Notes (Signed)
Patient admits to SI no plan and AVH. Patient denies HI at this time. Pt reports seeing flashes of light everyday, and feels someone cutting or hitting him everyday. Pt also reports hearing multiple voices talking to him and about him everyday. Plan of care discuss with patient. Encouragement and support provided and safety maintain. Q 15 min safety checks remain in place.

## 2014-12-04 NOTE — BH Assessment (Signed)
[facility] Thurman CoyerEric Kaplan, RN.   Rollen SoxMary Damarious Holtsclaw, MA, LPCA, LCASA Therapeutic Triage Specialist [facility]

## 2014-12-05 ENCOUNTER — Encounter (HOSPITAL_COMMUNITY): Payer: Self-pay | Admitting: Psychiatry

## 2014-12-05 DIAGNOSIS — F3189 Other bipolar disorder: Secondary | ICD-10-CM | POA: Diagnosis present

## 2014-12-05 DIAGNOSIS — R45851 Suicidal ideations: Secondary | ICD-10-CM

## 2014-12-05 DIAGNOSIS — F1994 Other psychoactive substance use, unspecified with psychoactive substance-induced mood disorder: Secondary | ICD-10-CM

## 2014-12-05 MED ORDER — ALUM & MAG HYDROXIDE-SIMETH 200-200-20 MG/5ML PO SUSP: 30.0000 mL | ORAL | Status: DC | PRN

## 2014-12-05 MED ORDER — QUETIAPINE FUMARATE 300 MG PO TABS
300.0000 mg | ORAL_TABLET | Freq: Every day | ORAL | Status: DC
  Administered 2014-12-05 – 2014-12-08 (×4): 300 mg via ORAL
  Filled 2014-12-05 (×5): qty 1
  Filled 2014-12-05: qty 3
  Filled 2014-12-05: qty 1

## 2014-12-05 MED ORDER — PANTOPRAZOLE SODIUM 40 MG PO TBEC
40.0000 mg | DELAYED_RELEASE_TABLET | Freq: Every day | ORAL | Status: DC
  Administered 2014-12-05 – 2014-12-09 (×5): 40 mg via ORAL
  Filled 2014-12-05 (×7): qty 1

## 2014-12-05 MED ORDER — CHLORPROMAZINE HCL 100 MG PO TABS
200.0000 mg | ORAL_TABLET | Freq: Once | ORAL | Status: AC
  Administered 2014-12-05: 200 mg via ORAL
  Filled 2014-12-05 (×2): qty 2

## 2014-12-05 MED ORDER — GABAPENTIN 300 MG PO CAPS
300.0000 mg | ORAL_CAPSULE | Freq: Three times a day (TID) | ORAL | Status: DC
  Administered 2014-12-06 – 2014-12-09 (×11): 300 mg via ORAL
  Filled 2014-12-05 (×3): qty 1
  Filled 2014-12-05: qty 9
  Filled 2014-12-05 (×10): qty 1
  Filled 2014-12-05 (×2): qty 9
  Filled 2014-12-05: qty 1

## 2014-12-05 MED ORDER — CHLORPROMAZINE HCL 50 MG PO TABS
200.0000 mg | ORAL_TABLET | Freq: Two times a day (BID) | ORAL | Status: DC
  Administered 2014-12-05 – 2014-12-09 (×7): 200 mg via ORAL
  Filled 2014-12-05: qty 2
  Filled 2014-12-05: qty 4
  Filled 2014-12-05: qty 2
  Filled 2014-12-05: qty 24
  Filled 2014-12-05: qty 2
  Filled 2014-12-05 (×4): qty 4
  Filled 2014-12-05: qty 2
  Filled 2014-12-05: qty 24
  Filled 2014-12-05 (×3): qty 4

## 2014-12-05 MED ORDER — BENZTROPINE MESYLATE 1 MG PO TABS
1.0000 mg | ORAL_TABLET | Freq: Two times a day (BID) | ORAL | Status: DC
  Administered 2014-12-05: 1 mg via ORAL
  Filled 2014-12-05 (×3): qty 1

## 2014-12-05 MED ORDER — QUETIAPINE FUMARATE ER 300 MG PO TB24
300.0000 mg | ORAL_TABLET | Freq: Every evening | ORAL | Status: DC
  Filled 2014-12-05: qty 1

## 2014-12-05 MED ORDER — CLONAZEPAM 1 MG PO TABS
2.0000 mg | ORAL_TABLET | Freq: Three times a day (TID) | ORAL | Status: DC | PRN
  Administered 2014-12-05: 2 mg via ORAL
  Filled 2014-12-05: qty 2

## 2014-12-05 MED ORDER — GABAPENTIN 400 MG PO CAPS
400.0000 mg | ORAL_CAPSULE | Freq: Three times a day (TID) | ORAL | Status: DC
  Administered 2014-12-05 (×2): 400 mg via ORAL
  Filled 2014-12-05 (×5): qty 1

## 2014-12-05 MED ORDER — MAGNESIUM HYDROXIDE 400 MG/5ML PO SUSP: 30.0000 mL | Freq: Every day | ORAL | Status: DC | PRN

## 2014-12-05 MED ORDER — CLONAZEPAM 1 MG PO TABS
2.0000 mg | ORAL_TABLET | Freq: Two times a day (BID) | ORAL | Status: DC | PRN
  Administered 2014-12-05: 2 mg via ORAL
  Filled 2014-12-05: qty 2

## 2014-12-05 MED ORDER — ACETAMINOPHEN 325 MG PO TABS: 650.0000 mg | ORAL_TABLET | Freq: Four times a day (QID) | ORAL | Status: DC | PRN

## 2014-12-05 NOTE — Progress Notes (Signed)
Patient ID: Harold Brooks, male   DOB: Jan 13, 1966, 49 y.o.   MRN: [id number]   D: Patient in room on approach. Pt mood and affect appeared depressed and flat. Pt in room most of the evening with minimal interaction with peers and staff. Pt endorses suicidal ideation without a plan. Pt contracted for safety with Probation officer. Pt  denies HI/AVH and pain. Cooperative with assessment. No acute distressed noted at this time.   A: Met with pt 1:1. Medications administered as prescribed. Writer encouraged pt to discuss feelings. Pt encouraged to come to staff with any question or concerns.   R: Patient remains safe and complaint with medications.

## 2014-12-05 NOTE — [facility] Suicide Risk Assessment (Signed)
[facility]   Nursing information obtained from:  Patient Demographic factors:  Male, Caucasian Current Mental Status:  Suicidal ideation indicated by patient, Self-harm thoughts Loss Factors:  NA Historical Factors:  Prior suicide attempts, Family history of mental illness or substance abuse Risk Reduction Factors:  Living with another person, especially a relative Total Time spent with patient: 45 minutes Principal Problem: Severe bipolar affective disorder with psychosis Diagnosis:   Patient Active Problem List   Diagnosis Date Noted  . Substance induced mood disorder [F19.94] 12/05/2014  . Severe bipolar affective disorder with psychosis [F31.89] 12/05/2014  . 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 PTSD (post-traumatic stress disorder) [F43.10] 01/13/2013  . Cocaine abuse [F14.10] 01/13/2013     Continued Clinical Symptoms:  Alcohol Use Disorder Identification Test Final Score (AUDIT): 9 The "Alcohol Use Disorders Identification Test", Guidelines for Use in Primary Care, Second Edition.  World Science writerHealth Organization [facility](WHO). Score between 0-7:  no or low risk or alcohol related problems. Score between 8-15:  moderate risk of alcohol related problems. Score between 16-19:  high risk of alcohol related problems. Score 20 or above:  warrants further diagnostic evaluation for alcohol dependence and treatment.   CLINICAL FACTORS:   Bipolar Disorder:   Depressive phase Alcohol/Substance Abuse/Dependencies   Muychiatric Specialty Exam: Physical Exam  ROS  Blood pressure 106/82, pulse 102, temperature 97.5 F (36.4 C), temperature source Oral, resp. rate 16, height 6\' 1"  (1.854 m), weight 82.101 kg (181 lb).Body mass index is 23.89 kg/(m^2).    COGNITIVE FEATURES THAT CONTRIBUTE TO RISK:  Closed-mindedness, Polarized thinking and Thought constriction (tunnel vision)    SUICIDE RISK:   Moderate:  Frequent suicidal ideation with limited  intensity, and duration, some specificity in terms of plans, no associated intent, good self-control, limited dysphoria/symptomatology, some risk factors present, and identifiable protective factors, including available and accessible social support.  PLAN OF CARE: Supportive approach/coping skills/relapse prevention                              Detox as needed/reassess and address the co morbidities  Medical Decision Making:  Review of Psycho-Social Stressors (1), Review or order clinical lab tests (1), Review of Medication Regimen & Side Effects (2) and Review of New Medication or Change in Dosage (2)  I certify that inpatient services furnished can reasonably be expected to improve the patient's condition.   Keiland Pickering A 12/05/2014, 4:58 PM

## 2014-12-05 NOTE — [facility] Group Notes (Signed)
[facility] LCSW Group Therapy 12/05/2014  1:15 PM   Type of Therapy: Group Therapy  Participation Level: Did Not Attend. Patient invited to participate but declined.   Isley Weisheit, MSW, LCSWA Clinical Social Worker [facility] [phone]   

## 2014-12-05 NOTE — H&P (Signed)
Psychiatric Admission Assessment Adult  Patient Identification: Harold Brooks MRN:  [id number] Date of Evaluation:  12/05/2014 Chief Complaint:  MDD recurrent severe  PTSD Schizophrenia Principal Diagnosis: <principal problem not specified> Diagnosis:   Patient Active Problem List   Diagnosis Date Noted  . Substance induced mood disorder [F19.94] 12/05/2014  . 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 PTSD (post-traumatic stress disorder) [F43.10] 01/13/2013  . Cocaine abuse [F14.10] 01/13/2013   History of Present Illness:: 49 Y/o male who states that he hears voices that tell him to kill himself or hurt other people. States that this has been going on for 20 years. States that his life style is affecting him. States he went into a cocaine binge. States this is affecting his relationship with his son. He is having thoughts of suicide. Has tried to kill himself  three times in the past. States his son OD last Friday was in ICU, he made it OK. States he asked God to take him instead of his son. He could not deal with it, went on a cocaine binge. The cocaine he admits makes the voices worst but they are there even when he is not using The initial assessment is as follows: Harold Brooks is an 49 y.o. male presented to [facility] wanting help for his cocaine abuse and depression. Pt reports using cocaine for 30 years, about one to two times a week or as often as he has money to buy cocaine. Pt reports he has lost jobs, relationships and struggled financially because of his cocaine use. Pt reports last using cocaine on 12/03/14 and he used about $100 worth. Pt reports using $1400 over the course of last weekend. Pt reports feeling suicidal without a plan because his son accidentally overdosed this last week. Pt reports he would rather die than watch his son die. Pt tried to kill himself by hanging himself in 2004. Pt reports he has been depressed for 20 years and symptoms include  worthlessness, hopelessness, guilty, low self-esteem, fatigue, stayed in bed, and has lost 10 lbs in 3 months. Pt reports seeing flashes of light everyday, and feels someone cutting or hitting him everyday. Pt also reports hearing multiple voices talking to him and about him everyday. Pt reports having flashbacks, dreams, reliving the situation and panic attacks from watching his girlfriend shoot herself 20 years ago. Per Merian Capron, MD, Pt meets inpatient criteria and placement will be sought.   Elements:  Location:  mood disorder with psychosis, cocaine dependence. Quality:  unable to function becoming suicidal having the voices telling him to kill himself. Severity:  severe. Timing:  every day. Duration:  worst in the last week after son OD. Context:  mood disorder wiht psychotic features with cocaine abuse reaacting to his son overdosing and he binging on coacine making the voices worst. Associated Signs/Symptoms: Depression Symptoms:  depressed mood, anhedonia, insomnia, fatigue, difficulty concentrating, suicidal thoughts with specific plan, anxiety, panic attacks, loss of energy/fatigue, disturbed sleep, weight loss, decreased appetite, (Hypo) Manic Symptoms:  Impulsivity, Irritable Mood, Labiality of Mood, Anxiety Symptoms:  Excessive Worry, Panic Symptoms, Psychotic Symptoms:  Hallucinations: Auditory Visual Paranoia, PTSD Symptoms: Had a traumatic exposure:  baby's mother killed himself in front of him 63 years son  Re-experiencing:  Flashbacks Intrusive Thoughts Nightmares Hypervigilance:  Yes Hyperarousal:  Increased Startle Response Irritability/Anger Total Time spent with patient: 45 minutes  Past Medical History:  Past Medical History  Diagnosis Date  . Kidney stones   . Schizophrenia  History reviewed. No pertinent past surgical history. Family History: History reviewed. No pertinent family history.  Son is addicted to IV drugs Social History:   History  Alcohol Use No     History  Drug Use  . Yes  . Special: Cocaine, Marijuana, Benzodiazepines    History   Social History  . Marital Status: Unknown    Spouse Name: N/A  . Number of Children: N/A  . Years of Education: N/A   Social History Main Topics  . Smoking status: Current Every Day Smoker    Types: Cigarettes  . Smokeless tobacco: Not on file  . Alcohol Use: No  . Drug Use: Yes    Special: Cocaine, Marijuana, Benzodiazepines  . Sexual Activity: Yes    Birth Control/ Protection: None   Other Topics Concern  . None   Social History Narrative  Lives with mother and son. States he has a disability check but not enough to have his own place. Went to the 8 th grade. Went to prison for 2 years. Since then in and out until 2012 Additional Social History:    Pain Medications: pt denies Prescriptions: see PTA list Over the Counter: denies History of alcohol / drug use?: Yes Negative Consequences of Use: Financial, Personal relationships, Work / Youth worker Name of Substance 1: Cocaine  1 - Age of First Use: 19 1 - Amount (size/oz): as much as he can get 1 - Frequency: one to two times a week 1 - Duration: 30 years 1 - Last Use / Amount: 12/03/14 $100 worth                   Musculoskeletal: Strength & Muscle Tone: within normal limits Gait & Station: normal Patient leans: N/A  Psychiatric Specialty Exam: Physical Exam  Review of Systems  Constitutional: Positive for weight loss and malaise/fatigue.       10 pounds in 3 months  HENT: Positive for hearing loss.   Eyes: Negative.   Respiratory:       Smokes a pack q 2 days  Cardiovascular: Positive for chest pain and palpitations.  Gastrointestinal: Positive for heartburn, nausea and diarrhea.  Genitourinary: Positive for dysuria and hematuria.  Musculoskeletal: Positive for myalgias, back pain and joint pain.  Skin: Negative.   Neurological: Positive for dizziness, weakness and headaches.   Endo/Heme/Allergies: Negative.   Psychiatric/Behavioral: Positive for depression, suicidal ideas, hallucinations and substance abuse. The patient is nervous/anxious and has insomnia.     Blood pressure 106/82, pulse 102, temperature 97.5 F (36.4 C), temperature source Oral, resp. rate 16, height 6' 1" (1.854 m), weight 82.101 kg (181 lb).Body mass index is 23.89 kg/(m^2).  General Appearance: Fairly Groomed  Engineer, water::  Minimal  Speech:  Clear and Coherent, Slow and not spontaneous  Volume:  fluctuates  Mood:  Anxious, Depressed, Dysphoric and Irritable  Affect:  Restricted  Thought Process:  Coherent and Goal Directed  Orientation:  Full (Time, Place, and Person)  Thought Content:  symptoms evnets worries concerns  Suicidal Thoughts:  Yes.  without intent/plan  Homicidal Thoughts:  No  Memory:  Immediate;   Fair Recent;   Fair Remote;   Fair  Judgement:  Fair  Insight:  Present and Shallow  Psychomotor Activity:  Restlessness  Concentration:  Fair  Recall:  [place]  Language: Fair  Akathisia:  No  Handed:  Right  AIMS (if indicated):     Assets:  Desire for Improvement Housing Social Support  ADL's:  Intact  Cognition: WNL  Sleep:  Number of Hours: 5.5   Risk to Self: Is patient at risk for suicide?: Yes Risk to Others:   Prior Inpatient Therapy:  [place] 3 -4 times ( last year ) then [facility] , Gaspar Cola about 7 times Prior Outpatient Therapy:  [facility], Dr. Kennith Gain   Alcohol Screening: 1. How often do you have a drink containing alcohol?: Monthly or less 2. How many drinks containing alcohol do you have on a typical day when you are drinking?: 1 or 2 3. How often do you have six or more drinks on one occasion?: Monthly Preliminary Score: 2 4. How often during the last year have you found that you were not able to stop drinking once you had started?: Monthly 5. How often during the last year  have you failed to do what was normally expected from you becasue of drinking?: Monthly 6. How often during the last year have you needed a first drink in the morning to get yourself going after a heavy drinking session?: Never 7. How often during the last year have you had a feeling of guilt of remorse after drinking?: Never 8. How often during the last year have you been unable to remember what happened the night before because you had been drinking?: Monthly 9. Have you or someone else been injured as a result of your drinking?: No 10. Has a relative or friend or a doctor or another health worker been concerned about your drinking or suggested you cut down?: No Alcohol Use Disorder Identification Test Final Score (AUDIT): 9 Brief Intervention: Yes  Allergies:  No Known Allergies Lab Results:  Results for orders placed or performed during the hospital encounter of 12/04/14 (from the past 48 hour(s))  CBC     Status: None   Collection Time: 12/04/14  2:19 PM  Result Value Ref Range   WBC 6.2 4.0 - 10.5 K/uL   RBC 4.59 4.22 - 5.81 MIL/uL   Hemoglobin 15.2 13.0 - 17.0 g/dL   HCT 43.8 39.0 - 52.0 %   MCV 95.4 78.0 - 100.0 fL   MCH 33.1 26.0 - 34.0 pg   MCHC 34.7 30.0 - 36.0 g/dL   RDW 13.1 11.5 - 15.5 %   Platelets 288 150 - 400 K/uL  Comprehensive metabolic panel     Status: Abnormal   Collection Time: 12/04/14  2:19 PM  Result Value Ref Range   Sodium 140 135 - 145 mmol/L   Potassium 3.8 3.5 - 5.1 mmol/L   Chloride 108 96 - 112 mmol/L   CO2 23 19 - 32 mmol/L   Glucose, Bld 136 (H) 70 - 99 mg/dL   BUN 7 6 - 23 mg/dL   Creatinine, Ser 0.98 0.50 - 1.35 mg/dL   Calcium 8.7 8.4 - 10.5 mg/dL   Total Protein 7.0 6.0 - 8.3 g/dL   Albumin 4.2 3.5 - 5.2 g/dL   AST 17 0 - 37 U/L   ALT 13 0 - 53 U/L   Alkaline Phosphatase 68 39 - 117 U/L   Total Bilirubin 0.3 0.3 - 1.2 mg/dL   GFR calc non Af Amer >90 >90 mL/min   GFR calc Af Amer >90 >90 mL/min    Comment: (NOTE) The eGFR has been  calculated using the CKD EPI equation. This calculation has not been validated in all clinical situations. eGFR's persistently <90 mL/min signify possible Chronic Kidney Disease.  Anion gap 9 5 - 15  Acetaminophen level     Status: Abnormal   Collection Time: 12/04/14  2:24 PM  Result Value Ref Range   Acetaminophen (Tylenol), Serum <10.0 (L) 10 - 30 ug/mL    Comment:        THERAPEUTIC CONCENTRATIONS VARY SIGNIFICANTLY. A RANGE OF 10-30 ug/mL MAY BE AN EFFECTIVE CONCENTRATION FOR MANY PATIENTS. HOWEVER, SOME ARE BEST TREATED AT CONCENTRATIONS OUTSIDE THIS RANGE. ACETAMINOPHEN CONCENTRATIONS >150 ug/mL AT 4 HOURS AFTER INGESTION AND >50 ug/mL AT 12 HOURS AFTER INGESTION ARE OFTEN ASSOCIATED WITH TOXIC REACTIONS.   Ethanol (ETOH)     Status: None   Collection Time: 12/04/14  2:24 PM  Result Value Ref Range   Alcohol, Ethyl (B) <5 0 - 9 mg/dL    Comment:        LOWEST DETECTABLE LIMIT FOR SERUM ALCOHOL IS 11 mg/dL FOR MEDICAL PURPOSES ONLY   Salicylate level     Status: None   Collection Time: 12/04/14  2:24 PM  Result Value Ref Range   Salicylate Lvl <4.0 2.8 - 20.0 mg/dL  I-stat troponin, ED (not at [facility])     Status: None   Collection Time: 12/04/14  2:29 PM  Result Value Ref Range   Troponin i, poc 0.00 0.00 - 0.08 ng/mL   Comment 3            Comment: Due to the release kinetics of cTnI, a negative result within the first hours of the onset of symptoms does not rule out myocardial infarction with certainty. If myocardial infarction is still suspected, repeat the test at appropriate intervals.   Urine Drug Screen     Status: Abnormal   Collection Time: 12/04/14  2:38 PM  Result Value Ref Range   Opiates NONE DETECTED NONE DETECTED   Cocaine POSITIVE (A) NONE DETECTED   Benzodiazepines NONE DETECTED NONE DETECTED   Amphetamines NONE DETECTED NONE DETECTED   Tetrahydrocannabinol POSITIVE (A) NONE DETECTED   Barbiturates NONE DETECTED NONE DETECTED     Comment:        DRUG SCREEN FOR MEDICAL PURPOSES ONLY.  IF CONFIRMATION IS NEEDED FOR ANY PURPOSE, NOTIFY LAB WITHIN 5 DAYS.        LOWEST DETECTABLE LIMITS FOR URINE DRUG SCREEN Drug Class       Cutoff (ng/mL) Amphetamine      1000 Barbiturate      200 Benzodiazepine   086 Tricyclics       761 Opiates          300 Cocaine          300 THC              50    Current Medications: Current Facility-Administered Medications  Medication Dose Route Frequency Provider Last Rate Last Dose  . acetaminophen (TYLENOL) tablet 650 mg  650 mg Oral Q6H PRN Laverle Hobby, PA-C      . alum & mag hydroxide-simeth (MAALOX/MYLANTA) 200-200-20 MG/5ML suspension 30 mL  30 mL Oral Q4H PRN Laverle Hobby, PA-C      . benztropine (COGENTIN) tablet 1 mg  1 mg Oral BID Laverle Hobby, PA-C   1 mg at 12/05/14 0756  . clonazePAM (KLONOPIN) tablet 2 mg  2 mg Oral TID PRN Laverle Hobby, PA-C   2 mg at 12/05/14 1300  . gabapentin (NEURONTIN) capsule 400 mg  400 mg Oral TID Laverle Hobby, PA-C   400 mg at 12/05/14 1300  .  magnesium hydroxide (MILK OF MAGNESIA) suspension 30 mL  30 mL Oral Daily PRN Laverle Hobby, PA-C      . pantoprazole (PROTONIX) EC tablet 40 mg  40 mg Oral Daily Laverle Hobby, PA-C   40 mg at 12/05/14 0757  . QUEtiapine (SEROQUEL XR) 24 hr tablet 300 mg  300 mg Oral QPM Laverle Hobby, PA-C       PTA Medications: Prescriptions prior to admission  Medication Sig Dispense Refill Last Dose  . benztropine (COGENTIN) 1 MG tablet Take 1 tablet (1 mg total) by mouth 2 (two) times daily. (Patient not taking: Reported on 12/04/2014) 60 tablet 0   . buPROPion (WELLBUTRIN XL) 300 MG 24 hr tablet Take 1 tablet (300 mg total) by mouth daily. (Patient not taking: Reported on 12/04/2014) 30 tablet 0   . chlorproMAZINE (THORAZINE) 100 MG tablet Take 100 mg by mouth 3 (three) times daily.  4 12/03/2014 at Unknown time  . chlorproMAZINE (THORAZINE) 50 MG tablet Take 1 tablet (50 mg total) by mouth 2  (two) times daily. (Patient not taking: Reported on 12/04/2014) 60 tablet 0   . clonazePAM (KLONOPIN) 0.5 MG tablet Take 2 tablets (1 mg total) by mouth 2 (two) times daily. (Patient not taking: Reported on 12/04/2014) 14 tablet 0   . clonazePAM (KLONOPIN) 2 MG tablet Take 2 mg by mouth 3 (three) times daily.  0 12/03/2014 at Unknown time  . gabapentin (NEURONTIN) 300 MG capsule Take 1 capsule (300 mg total) by mouth 3 (three) times daily. (Patient not taking: Reported on 12/04/2014) 90 capsule 0   . gabapentin (NEURONTIN) 400 MG capsule Take 400 mg by mouth 3 (three) times daily.  4 12/03/2014 at Unknown time  . methocarbamol (ROBAXIN) 500 MG tablet Take 1 tablet (500 mg total) by mouth 3 (three) times daily. (Patient not taking: Reported on 12/04/2014) 90 tablet 0   . pantoprazole (PROTONIX) 40 MG tablet Take 1 tablet (40 mg total) by mouth daily. (Patient not taking: Reported on 12/04/2014) 30 tablet 0   . SEROQUEL XR 300 MG 24 hr tablet Take 300 mg by mouth every evening.  4 12/03/2014 at Unknown time  . traZODone (DESYREL) 100 MG tablet Take 1 tablet (100 mg total) by mouth at bedtime as needed for sleep. (Patient not taking: Reported on 12/04/2014) 30 tablet 0     Previous Psychotropic Medications: Yes Thorazine 200 mg TID. Neurontin 300 mg TID, Klonopin 2 mg TID, Seroquel 300 mg HS  Substance Abuse History in the last 12 months:  Yes.      Consequences of Substance Abuse: Legal Consequences:  DWI 5 last 1997, has had to pull 13 years acting out when under the influence  Blackouts:   Withdrawal Symptoms:   Diaphoresis Diarrhea Headaches Nausea Tremors Vomiting  Results for orders placed or performed during the hospital encounter of 12/04/14 (from the past 72 hour(s))  CBC     Status: None   Collection Time: 12/04/14  2:19 PM  Result Value Ref Range   WBC 6.2 4.0 - 10.5 K/uL   RBC 4.59 4.22 - 5.81 MIL/uL   Hemoglobin 15.2 13.0 - 17.0 g/dL   HCT 43.8 39.0 - 52.0 %   MCV 95.4 78.0 - 100.0 fL    MCH 33.1 26.0 - 34.0 pg   MCHC 34.7 30.0 - 36.0 g/dL   RDW 13.1 11.5 - 15.5 %   Platelets 288 150 - 400 K/uL  Comprehensive metabolic panel  Status: Abnormal   Collection Time: 12/04/14  2:19 PM  Result Value Ref Range   Sodium 140 135 - 145 mmol/L   Potassium 3.8 3.5 - 5.1 mmol/L   Chloride 108 96 - 112 mmol/L   CO2 23 19 - 32 mmol/L   Glucose, Bld 136 (H) 70 - 99 mg/dL   BUN 7 6 - 23 mg/dL   Creatinine, Ser 0.98 0.50 - 1.35 mg/dL   Calcium 8.7 8.4 - 10.5 mg/dL   Total Protein 7.0 6.0 - 8.3 g/dL   Albumin 4.2 3.5 - 5.2 g/dL   AST 17 0 - 37 U/L   ALT 13 0 - 53 U/L   Alkaline Phosphatase 68 39 - 117 U/L   Total Bilirubin 0.3 0.3 - 1.2 mg/dL   GFR calc non Af Amer >90 >90 mL/min   GFR calc Af Amer >90 >90 mL/min    Comment: (NOTE) The eGFR has been calculated using the CKD EPI equation. This calculation has not been validated in all clinical situations. eGFR's persistently <90 mL/min signify possible Chronic Kidney Disease.    Anion gap 9 5 - 15  Acetaminophen level     Status: Abnormal   Collection Time: 12/04/14  2:24 PM  Result Value Ref Range   Acetaminophen (Tylenol), Serum <10.0 (L) 10 - 30 ug/mL    Comment:        THERAPEUTIC CONCENTRATIONS VARY SIGNIFICANTLY. A RANGE OF 10-30 ug/mL MAY BE AN EFFECTIVE CONCENTRATION FOR MANY PATIENTS. HOWEVER, SOME ARE BEST TREATED AT CONCENTRATIONS OUTSIDE THIS RANGE. ACETAMINOPHEN CONCENTRATIONS >150 ug/mL AT 4 HOURS AFTER INGESTION AND >50 ug/mL AT 12 HOURS AFTER INGESTION ARE OFTEN ASSOCIATED WITH TOXIC REACTIONS.   Ethanol (ETOH)     Status: None   Collection Time: 12/04/14  2:24 PM  Result Value Ref Range   Alcohol, Ethyl (B) <5 0 - 9 mg/dL    Comment:        LOWEST DETECTABLE LIMIT FOR SERUM ALCOHOL IS 11 mg/dL FOR MEDICAL PURPOSES ONLY   Salicylate level     Status: None   Collection Time: 12/04/14  2:24 PM  Result Value Ref Range   Salicylate Lvl <4.1 2.8 - 20.0 mg/dL  I-stat troponin, ED (not at [facility])      Status: None   Collection Time: 12/04/14  2:29 PM  Result Value Ref Range   Troponin i, poc 0.00 0.00 - 0.08 ng/mL   Comment 3            Comment: Due to the release kinetics of cTnI, a negative result within the first hours of the onset of symptoms does not rule out myocardial infarction with certainty. If myocardial infarction is still suspected, repeat the test at appropriate intervals.   Urine Drug Screen     Status: Abnormal   Collection Time: 12/04/14  2:38 PM  Result Value Ref Range   Opiates NONE DETECTED NONE DETECTED   Cocaine POSITIVE (A) NONE DETECTED   Benzodiazepines NONE DETECTED NONE DETECTED   Amphetamines NONE DETECTED NONE DETECTED   Tetrahydrocannabinol POSITIVE (A) NONE DETECTED   Barbiturates NONE DETECTED NONE DETECTED    Comment:        DRUG SCREEN FOR MEDICAL PURPOSES ONLY.  IF CONFIRMATION IS NEEDED FOR ANY PURPOSE, NOTIFY LAB WITHIN 5 DAYS.        LOWEST DETECTABLE LIMITS FOR URINE DRUG SCREEN Drug Class       Cutoff (ng/mL) Amphetamine      1000 Barbiturate  200 Benzodiazepine   786 Tricyclics       767 Opiates          300 Cocaine          300 THC              50     Observation Level/Precautions:  15 minute checks  Laboratory:  As per the ED  Psychotherapy: individual and group psychotherapy  Medications:  Continue the Thorazine the Seroquel, the Neurontin, the Klonopin ( he is going to be kept on it)   Consultations:    Discharge Concerns:    Estimated LOS: 5-7 days  Other:     Psychological Evaluations: No   Treatment Plan Summary: Daily contact with patient to assess and evaluate symptoms and progress in treatment and Medication management Supportive approach/coping skills Mood disorder with psychotic features R/O Bipolar Disorder  with psychotic features; will resume the Thorazine the Seroquel  Anxiety; will continue the klonopin at a lower dosage: states he will be kept on Klonopin upon D/C PTSD; will explore how his son  OD trigger memories of his GF suicide Medical Decision Making:  Review of Psycho-Social Stressors (1), Review or order clinical lab tests (1), Review of Medication Regimen & Side Effects (2) and Review of New Medication or Change in Dosage (2)  I certify that inpatient services furnished can reasonably be expected to improve the patient's condition.   Abella Shugart A 4/7/20161:31 PM

## 2014-12-05 NOTE — [facility] Group Notes (Signed)
[facility] Group Notes:  (Nursing/MHT/Case Management/Adjunct)  Date:  12/05/2014  Time:  0900   Type of Therapy:  Nurse Education  Participation Level:  Did Not Attend  Participation Quality:    Affect:    Cognitive:    Insight:    Engagement in Group:    Modes of Intervention:    Summary of Progress/Problems:  Harold Brooks, Harold Brooks 12/05/2014, 9:23 AM

## 2014-12-05 NOTE — ED Provider Notes (Signed)
CSN: [id number]     Arrival date & time 12/04/14  1402 History   First MD Initiated Contact with Patient 12/04/14 1505     Chief Complaint  Patient presents with  . Suicidal     (Consider location/radiation/quality/duration/timing/severity/associated sxs/prior Treatment) HPI Patient presents to the emergency department with suicidal ideation that been ongoing for several weeks.  The patient states that he has had a lot of family issues that it caused problems for him.  The patient states he has a history of schizophrenia and has had multiple suicide attempts in the past.  Patient denies hallucinations, nausea, vomiting, weakness, dizziness, headache, blurred vision, back pain, neck pain, chest pain, shortness of breath or syncope.  Patient states nothing seems make his condition better.   Past Medical History  Diagnosis Date  . Kidney stones   . Schizophrenia    History reviewed. No pertinent past surgical history. History reviewed. No pertinent family history. History  Substance Use Topics  . Smoking status: Current Every Day Smoker    Types: Cigarettes  . Smokeless tobacco: Not on file  . Alcohol Use: No    Review of Systems All other systems negative except as documented in the HPI. All pertinent positives and negatives as reviewed in the HPI.   Allergies  Review of patient's allergies indicates no known allergies.  Home Medications   Prior to Admission medications   Medication Sig Start Date End Date Taking? Authorizing Provider  chlorproMAZINE (THORAZINE) 100 MG tablet Take 100 mg by mouth 3 (three) times daily. 11/14/14  Yes Historical Provider, MD  clonazePAM (KLONOPIN) 2 MG tablet Take 2 mg by mouth 3 (three) times daily. 11/14/14  Yes Historical Provider, MD  gabapentin (NEURONTIN) 400 MG capsule Take 400 mg by mouth 3 (three) times daily. 11/14/14  Yes Historical Provider, MD  SEROQUEL XR 300 MG 24 hr tablet Take 300 mg by mouth every evening. 11/14/14  Yes Historical  Provider, MD  benztropine (COGENTIN) 1 MG tablet Take 1 tablet (1 mg total) by mouth 2 (two) times daily. Patient not taking: Reported on 12/04/2014 01/17/13   Charm RingsJamison Y Lord, NP  buPROPion (WELLBUTRIN XL) 300 MG 24 hr tablet Take 1 tablet (300 mg total) by mouth daily. Patient not taking: Reported on 12/04/2014 01/17/13   Charm RingsJamison Y Lord, NP  chlorproMAZINE (THORAZINE) 50 MG tablet Take 1 tablet (50 mg total) by mouth 2 (two) times daily. Patient not taking: Reported on 12/04/2014 01/17/13   Charm RingsJamison Y Lord, NP  clonazePAM (KLONOPIN) 0.5 MG tablet Take 2 tablets (1 mg total) by mouth 2 (two) times daily. Patient not taking: Reported on 12/04/2014 01/17/13   Charm RingsJamison Y Lord, NP  gabapentin (NEURONTIN) 300 MG capsule Take 1 capsule (300 mg total) by mouth 3 (three) times daily. Patient not taking: Reported on 12/04/2014 01/17/13   Charm RingsJamison Y Lord, NP  methocarbamol (ROBAXIN) 500 MG tablet Take 1 tablet (500 mg total) by mouth 3 (three) times daily. Patient not taking: Reported on 12/04/2014 01/17/13   Charm RingsJamison Y Lord, NP  pantoprazole (PROTONIX) 40 MG tablet Take 1 tablet (40 mg total) by mouth daily. Patient not taking: Reported on 12/04/2014 01/17/13   Charm RingsJamison Y Lord, NP  traZODone (DESYREL) 100 MG tablet Take 1 tablet (100 mg total) by mouth at bedtime as needed for sleep. Patient not taking: Reported on 12/04/2014 01/17/13   Charm RingsJamison Y Lord, NP   BP 121/77 mmHg  Pulse 84  Temp(Src) 98.2 F (36.8 C) (Oral)  Resp 16  SpO2 97% Physical Exam  Constitutional: He is oriented to person, place, and time. He appears well-developed and well-nourished. No distress.  HENT:  Head: Normocephalic and atraumatic.  Mouth/Throat: Oropharynx is clear and moist.  Eyes: Pupils are equal, round, and reactive to light.  Neck: Normal range of motion. Neck supple.  Cardiovascular: Normal rate, regular rhythm and normal heart sounds.  Exam reveals no gallop and no friction rub.   No murmur heard. Pulmonary/Chest: Effort normal and  breath sounds normal. No respiratory distress.  Neurological: He is alert and oriented to person, place, and time. He exhibits normal muscle tone. Coordination normal.  Skin: Skin is warm and dry. No rash noted. No erythema.  Nursing note and vitals reviewed.   ED Course  Procedures (including critical care time) Labs Review Labs Reviewed  ACETAMINOPHEN LEVEL - Abnormal; Notable for the following:    Acetaminophen (Tylenol), Serum <10.0 (*)    All other components within normal limits  COMPREHENSIVE METABOLIC PANEL - Abnormal; Notable for the following:    Glucose, Bld 136 (*)    All other components within normal limits  URINE RAPID DRUG SCREEN (HOSP PERFORMED) - Abnormal; Notable for the following:    Cocaine POSITIVE (*)    Tetrahydrocannabinol POSITIVE (*)    All other components within normal limits  CBC  ETHANOL  SALICYLATE LEVEL  I-STAT TROPOININ, ED    Imaging Review No results found.   EKG Interpretation   Date/Time:  Wednesday December 04 2014 14:19:10 EDT Ventricular Rate:  80 PR Interval:  156 QRS Duration: 81 QT Interval:  387 QTC Calculation: 446 R Axis:   40 Text Interpretation:  Sinus rhythm No old tracing to compare Confirmed by  [facility]  MD, ELLIOTT [phone]) on 12/04/2014 2:49:41 PM      Patient will need TTS assessment   Charlestine Night, PA-C 12/05/14 0155  Elwin Mocha, MD 12/10/14 Ernestina [place]

## 2014-12-05 NOTE — Progress Notes (Addendum)
D: Patient irritable when taking meds this morning.  He wanted to know when he would be put on the "right medications.  The ones I usually take.  I usually take thorazine and clonopin." Informed patient to talk with MD regarding his medications.  He refused to fill out his inventory sheet.  States he is drowsy and "just wanna sleep."  He did not attend group this morning.  When asked about SI/HI/AVH, he stated, "I don't know how I feel, hell I just work up." A: Continue to monitor medication management and MD orders.  Safety checks completed every 15 minutes per protocol.  Meet 1;1 with patient to discuss concerns and offer encouragement. R: Patient has minimal interaction with staff and peers.

## 2014-12-05 NOTE — [facility] Counselor (Signed)
CSW attempted to complete PSA, patient requested to talk at a later time. CSW to complete PSA at a later time.  Samuella BruinKristin Vadis Slabach, MSW, [employer]CSWA Clinical Social Worker [facility] [phone]
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
## 2014-12-05 NOTE — Tx Team (Signed)
Initial Interdisciplinary Treatment Plan   PATIENT STRESSORS: Marital or family conflict Substance abuse Traumatic event   PATIENT STRENGTHS: Ability for insight Average or above average intelligence Communication skills Motivation for treatment/growth   PROBLEM LIST: Problem List/Patient Goals Date to be addressed Date deferred Reason deferred Estimated date of resolution  Depression "help with depression" 12/05/14   At d/c  suicidal ideations 12/05/14   At d/c  psychosis 12/05/14   At d/c  Substance abuse 12/05/14   At d/c                                 DISCHARGE CRITERIA:  Improved stabilization in mood, thinking, and/or behavior Motivation to continue treatment in a less acute level of care Need for constant or close observation no longer present Verbal commitment to aftercare and medication compliance  PRELIMINARY DISCHARGE PLAN: Outpatient therapy  PATIENT/FAMIILY INVOLVEMENT: This treatment plan has been presented to and reviewed with the patient, Harold Brooks.  The patient and family have been given the opportunity to ask questions and make suggestions.  Harold Brooks, Harold Brooks 12/05/2014, 5:03 AM

## 2014-12-05 NOTE — Progress Notes (Signed)
Pt initially went to [place], then came back Early and went to his room.

## 2014-12-06 LAB — LIPID PANEL
Cholesterol: 146 mg/dL (ref 0–200)
HDL: 40 mg/dL (ref >39)
LDL CALC: 72 mg/dL (ref 0–99)
Total CHOL/HDL Ratio: 3.7 RATIO
Triglycerides: 168 mg/dL — ABNORMAL HIGH (ref <150)
VLDL: 34 mg/dL (ref 0–40)

## 2014-12-06 MED ORDER — ENSURE ENLIVE PO LIQD
237.0000 mL | Freq: Two times a day (BID) | ORAL | Status: DC
  Administered 2014-12-07 – 2014-12-09 (×4): 237 mL via ORAL

## 2014-12-06 MED ORDER — CLONAZEPAM 1 MG PO TABS
1.5000 mg | ORAL_TABLET | Freq: Two times a day (BID) | ORAL | Status: DC | PRN
  Administered 2014-12-07 – 2014-12-08 (×4): 1.5 mg via ORAL
  Filled 2014-12-06 (×9): qty 1

## 2014-12-06 NOTE — Progress Notes (Signed)
NUTRITION ASSESSMENT  Pt identified as at risk on the Malnutrition Screen Tool  INTERVENTION: 1. Educated patient on the importance of nutrition and encouraged intake of food and beverages. 2. Discussed weight goals. 3. Supplements: Ensure Enlive po BID, each supplement provides 350 kcal and 20 grams of protein  NUTRITION DIAGNOSIS: Unintentional weight loss related to sub-optimal intake as evidenced by pt report.   Goal: Pt to meet >/= 90% of their estimated nutrition needs.  Monitor:  PO intake  Assessment:  Pt admitted with substance abuse.  Pt reports weight loss of 10 lb over the last 3 months, UBW is 190-195 lb. Pt states he would go days without eating "on the streets".  Pt would like to try Ensure supplements. RD to order.  Height: Ht Readings from Last 1 Encounters:  12/04/14 6\' 1"  (1.854 m)    Weight: Wt Readings from Last 1 Encounters:  12/04/14 181 lb (82.101 kg)    Weight Hx: Wt Readings from Last 10 Encounters:  12/04/14 181 lb (82.101 kg)  01/13/13 159 lb (72.122 kg)    BMI:  Body mass index is 23.89 kg/(m^2). Pt meets criteria for normal range based on current BMI.  Estimated Nutritional Needs: Kcal: 25-30 kcal/kg Protein: > 1 gram protein/kg Fluid: 1 ml/kcal  Diet Order: Diet regular Room service appropriate?: Yes; Fluid consistency:: Thin Pt is also offered choice of unit snacks mid-morning and mid-afternoon.  Pt is eating as desired.   Lab results and medications reviewed.   Harold FrancoLindsey Meika Earll, MS, RD, LDN Pager: [phone] After Hours Pager: [phone]

## 2014-12-06 NOTE — Tx Team (Signed)
Interdisciplinary Treatment Plan Update (Adult) Date: 12/06/2014   Time Reviewed: 9:30 AM  Progress in Treatment: Attending groups: No Participating in groups: No Taking medication as prescribed: Yes Tolerating medication: Yes Family/Significant other contact made: No, CSW assessing for appropriate resources Patient understands diagnosis: Yes Discussing patient identified problems/goals with staff: Yes Medical problems stabilized or resolved: Yes Denies suicidal/homicidal ideation: Yes Issues/concerns per patient self-inventory: Yes Other:  New problem(s) identified: N/A  Discharge Plan or Barriers:   4/8: Patient requesting a 30 day residential program at discharge. Patient is not eligible for [facility] [place] and has [facility] before but does not want to return there. Patient likely no eligible for [facility] due to experiencing AVH.   Reason for Continuation of Hospitalization:  Depression Anxiety Medication Stabilization   Comments: N/A  Estimated length of stay: 2-3 days  For review of initial/current patient goals, please see plan of care.  Patient is a 49 year old Male admitted for SI and substance abuse. [place] in [place] his mother and son. Patient will benefit from crisis stabilization, medication evaluation, group therapy, and psycho education in addition to case management for discharge planning. Patient and CSW reviewed pt's identified goals and treatment plan. Pt verbalized understanding and agreed to treatment plan.   Attendees: Patient:    Family:    Physician: Dr. Jama Flavorsobos; Dr. Dub MikesLugo; Dr. Jama Flavorsobos; Dr. Elna BreslowEappen  12/06/2014 9:30 AM  Nursing: Shelda JakesPatty Duke, Omelia BlackwaterNoelle Ardley, Bebe LiterKim Okafore, Marion [name] RN 12/06/2014 9:30 AM  Clinical Social Worker: Belenda CruiseKristin Sian Joles,  LCSWA 12/06/2014 9:30 AM  Other: Juline PatchQuylle Hodnett, LCSW 12/06/2014 9:30 AM  Other: Leisa LenzValerie Enoch, Vesta MixerMonarch Liaison 12/06/2014 9:30 AM  Other: Onnie BoerJennifer Clark, Case  Manager 12/06/2014 9:30 AM  Other: Mosetta AnisAggie Nwoko, Laura Davis NP 12/06/2014 9:30 AM  Other: Trula SladeHeather Smart, LCSW 12/06/2014 9:30 AM  Other:    Other:       Scribe for Treatment Team:  Samuella BruinKristin Kalep Full, MSW, [employer]CSWA [phone]

## 2014-12-06 NOTE — Plan of Care (Signed)
Problem: Alteration in mood Goal: STG-Patient is able to discuss feelings and issues (Patient is able to discuss feelings and issues leading to depression)  Outcome: Not Progressing Pt interaction with staff and peers is minimal. Pt did not attend evening AA group.

## 2014-12-06 NOTE — Progress Notes (Signed)
Harold Brooks has been very uncomfortable today. Sleeping most of the day, disinteresed in attending groups Harold Brooks has remained focused on his physical complaints ' all day and has gooten OOB only to toilet and go to the cafe'.   A Harold Brooks contracts for safety . Harold Brooks has been encouraged to force fluids and has had 2 ensures.   R Safety in place and no withdrawal complications observed.

## 2014-12-06 NOTE — Progress Notes (Signed)
Adult Psychoeducational Group Note  Date:  12/06/2014 Time:  8:00pm Group Topic/Focus:  Wrap-Up Group:   The focus of this group is to help patients review their daily goal of treatment and discuss progress on daily workbooks.  Participation Level:  Did Not Attend  Additional Comments: Pt. Didn't attend wrap up group with AA.   Bing PlumeScott, Tesla Bochicchio D 12/06/2014, 9:58 PM

## 2014-12-06 NOTE — Progress Notes (Signed)
Patient ID: Harold Brooks, male   DOB: 1966/03/20, 49 y.o.   MRN: [id number]   D: Patient mood and affect appeared depressed and flat. Pt out of his room on the phone through out the evening. Pt irritable and unwilling to attend/participate group. Pt interaction with peers and staff is minimal. Pt endorses suicidal ideation without a plan. Pt contracted for safety with Probation officer. Pt denies HI/AVH and pain. No acute distressed noted at this time.   A: Met with pt 1:1. Medications administered as prescribed. Writer encouraged pt to discuss feelings and attend groups. Pt encouraged to come to staff with any question or concerns.   R: Patient remains safe and complaint with medications.

## 2014-12-06 NOTE — [facility] Group Notes (Signed)
[facility] LCSW Group Therapy 12/06/2014  1:15 PM   Type of Therapy: Group Therapy  Participation Level: Did Not Attend. Patient invited to participate but declined.   Kiaya Haliburton, MSW, LCSWA Clinical Social Worker [facility] [phone]    

## 2014-12-06 NOTE — [facility] Group Notes (Signed)
[facility] Note  12/06/2014  8:45 AM  Participation Quality: Did Not Attend. Patient invited to participate but declined.  Samuella BruinKristin Shafter Jupin, MSW, [employer]CS[employer] Clinical Social Worker [facility] [phone]

## 2014-12-06 NOTE — Progress Notes (Signed)
[facility] MD Progress Note  12/06/2014 3:41 PM Harold Brooks  MRN:  [id number] Subjective:  Iman continues to have a hard time. He states he wants to go to a residential treatment program after here as states he is not going to be able to make it. He states " so you are going to boot me out of here?" He was told that if he was going to a residential treatment program, he would have to be detox off the klonopin. He asked why? An explanation was given and he kept asking the same question over and over again. He states he was thinking about staying here closer to 30 days. He states that the Seroquel and the Thorazine are for his "Bipolar" so " what are you going to give me for my panic if you take away the klonopin? Principal Problem: Severe bipolar affective disorder with psychosis Diagnosis:   Patient Active Problem List   Diagnosis Date Noted  . Substance induced mood disorder [F19.94] 12/05/2014  . Severe bipolar affective disorder with psychosis [F31.89] 12/05/2014  . 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 PTSD (post-traumatic stress disorder) [F43.10] 01/13/2013  . Cocaine abuse [F14.10] 01/13/2013   Total Time spent with patient: 30 minutes   Past Medical History:  Past Medical History  Diagnosis Date  . Kidney stones   . Schizophrenia    History reviewed. No pertinent past surgical history. Family History: History reviewed. No pertinent family history. Social History:  History  Alcohol Use No     History  Drug Use  . Yes  . Special: Cocaine, Marijuana, Benzodiazepines    History   Social History  . Marital Status: Unknown    Spouse Name: N/A  . Number of Children: N/A  . Years of Education: N/A   Social History Main Topics  . Smoking status: Current Every Day Smoker    Types: Cigarettes  . Smokeless tobacco: Not on file  . Alcohol Use: No  . Drug Use: Yes    Special: Cocaine, Marijuana, Benzodiazepines  . Sexual Activity: Yes    Birth Control/  Protection: None   Other Topics Concern  . None   Social History Narrative   Additional History:    Sleep: Fair  Appetite:  Fair   Assessment:   Musculoskeletal: Strength & Muscle Tone: within normal limits Gait & Station: normal Patient leans: N/A   Psychiatric Specialty Exam: Physical Exam  Review of Systems  Constitutional: Positive for malaise/fatigue.  HENT: Negative.   Eyes: Negative.   Respiratory: Negative.   Cardiovascular: Negative.   Gastrointestinal: Positive for abdominal pain and diarrhea.  Genitourinary: Negative.   Musculoskeletal: Positive for myalgias and back pain.  Skin: Negative.   Neurological: Positive for weakness.  Endo/Heme/Allergies: Negative.   Psychiatric/Behavioral: Positive for depression and substance abuse. The patient is nervous/anxious.     Blood pressure 104/72, pulse 109, temperature 98 F (36.7 C), temperature source Oral, resp. rate 16, height  (1.854 m), weight 82.101 kg (181 lb).Body mass index is 23.89 kg/(m^2).  General Appearance: Disheveled  Eye Contact::  Minimal  Speech:  Clear and Coherent and Slow  Volume:  Decreased  Mood:  Anxious, Depressed and Irritable  Affect:  Restricted  Thought Process:  Coherent and Goal Directed  Orientation:  Other:  person place  Thought Content:  symptoms events worries concerns  Suicidal Thoughts:  "still have thoughts" no plan or intent  Homicidal Thoughts:  No  Memory:  Immediate;   Fair Recent;  Fair Remote;   Fair  Judgement:  Fair  Insight:  Shallow  Psychomotor Activity:  Restlessness  Concentration:  Poor  Recall:  Poor  Fund of Knowledge:Fair  Language: Fair  Akathisia:  No  Handed:  Right  AIMS (if indicated):     Assets:  Desire for Improvement Housing Social Support  ADL's:  Intact  Cognition: WNL  Sleep:  Number of Hours: 6.25     Current Medications: Current Facility-Administered Medications  Medication Dose Route Frequency Provider Last Rate  Last Dose  . acetaminophen (TYLENOL) tablet 650 mg  650 mg Oral Q6H PRN Kerry Hough, PA-C      . alum & mag hydroxide-simeth (MAALOX/MYLANTA) 200-200-20 MG/5ML suspension 30 mL  30 mL Oral Q4H PRN Kerry Hough, PA-C      . chlorproMAZINE (THORAZINE) tablet 200 mg  200 mg Oral BID Rachael Fee, MD   200 mg at 12/06/14 0835  . clonazePAM (KLONOPIN) tablet 1.5 mg  1.5 mg Oral BID PRN Rachael Fee, MD      . gabapentin (NEURONTIN) capsule 300 mg  300 mg Oral TID Rachael Fee, MD   300 mg at 12/06/14 1248  . magnesium hydroxide (MILK OF MAGNESIA) suspension 30 mL  30 mL Oral Daily PRN Kerry Hough, PA-C      . pantoprazole (PROTONIX) EC tablet 40 mg  40 mg Oral Daily Kerry Hough, PA-C   40 mg at 12/06/14 0835  . QUEtiapine (SEROQUEL) tablet 300 mg  300 mg Oral QHS Rachael Fee, MD   300 mg at 12/05/14 2235    Lab Results:  Results for orders placed or performed during the hospital encounter of 12/04/14 (from the past 48 hour(s))  Lipid panel     Status: Abnormal   Collection Time: 12/06/14  6:40 AM  Result Value Ref Range   Cholesterol 146 0 - 200 mg/dL   Triglycerides 161 (H) <150 mg/dL   HDL 40 >09 mg/dL   Total CHOL/HDL Ratio 3.7 RATIO   VLDL 34 0 - 40 mg/dL   LDL Cholesterol 72 0 - 99 mg/dL    Comment:        Total Cholesterol/HDL:CHD Risk Coronary Heart Disease Risk Table                     Men   Women  1/2 Average Risk   3.4   3.3  Average Risk       5.0   4.4  2 X Average Risk   9.6   7.1  3 X Average Risk  23.4   11.0        Use the calculated Patient Ratio above and the CHD Risk Table to determine the patient's CHD Risk.        ATP III CLASSIFICATION (LDL):  <100     mg/dL   Optimal  604-540  mg/dL   Near or Above                    Optimal  130-159  mg/dL   Borderline  981-191  mg/dL   High  >478     mg/dL   Very High Performed at [facility]     Physical Findings: AIMS: Facial and Oral Movements Muscles of Facial Expression: None,  normal Lips and Perioral Area: None, normal Jaw: None, normal Tongue: None, normal,Extremity Movements Upper (arms, wrists, hands, fingers): None, normal Lower (legs, knees, ankles, toes):  None, normal, Trunk Movements Neck, shoulders, hips: None, normal, Overall Severity Severity of abnormal movements (highest score from questions above): None, normal Incapacitation due to abnormal movements: None, normal Patient's awareness of abnormal movements (rate only patient's report): No Awareness, Dental Status Current problems with teeth and/or dentures?: Yes ("couple cavities") Does patient usually wear dentures?: No  CIWA:  CIWA-Ar Total: 0 COWS:     Treatment Plan Summary: Daily contact with patient to assess and evaluate symptoms and progress in treatment and Medication management Supportive approach/coping skills Bipolar Disorder; optimize response to the Thorazine/Seroquel combination PTSD; pursue further trauma work Cocaine abuse; continue to monitor mood instability coming from withdrawal Benzodiazepine dependence: will start weaning off the Klonopin. Will decrease to 1.5 mg BID PRN with plans to decrease to 1 mg BID PRN by Sunday Will work a relapse prevention plan Will use CBT/mindfulness to deal with the panic/anxiety as he comes off the Klonopin Explore residential treatment options  Medical Decision Making:  Review of Psycho-Social Stressors (1), Review of Medication Regimen & Side Effects (2) and Review of New Medication or Change in Dosage (2)     Myrtice Lowdermilk A 12/06/2014, 3:41 PM

## 2014-12-06 NOTE — Progress Notes (Signed)
Recreation Therapy Notes  Date: 04.08.2016 Time: 9:30am Location: [address] Group Room   Group Topic: Stress Management  Goal Area(s) Addresses:  Patient will actively participate in stress management techniques presented during session.   Behavioral Response: Did not attend.   Marykay Lexenise L Shia Delaine, LRT/CTRS  Jearl KlinefelterBlanchfield, Mysti Haley L 12/06/2014 5:56 PM

## 2014-12-06 NOTE — Progress Notes (Signed)
Pt did not attend group. 

## 2014-12-07 DIAGNOSIS — F3189 Other bipolar disorder: Secondary | ICD-10-CM

## 2014-12-07 LAB — HEMOGLOBIN A1C
Hgb A1c MFr Bld: 5.6 % (ref 4.8–5.6)
Mean Plasma Glucose: 114 mg/dL

## 2014-12-07 LAB — PROLACTIN: PROLACTIN: 38.9 ng/mL — AB (ref 4.0–15.2)

## 2014-12-07 NOTE — [facility] Group Notes (Signed)
[facility] Group Notes: (Clinical Social Work)   12/07/2014      Type of Therapy:  Group Therapy   Participation Level:  Did Not Attend despite MHT prompting   Cambre Matson Grossman-Orr, LCSW 12/07/2014, 3:22 PM     

## 2014-12-07 NOTE — Progress Notes (Signed)
Patient ID: Harold Brooks D Basic, male   DOB: 07/16/1966, 49 y.o.   MRN: [id number] D  --  Pt. Has stayed in his room in bed for most of this shift.  He is irritable and complains of anxiety.  He shows no anger issues but the irritability is monitored by Clinical research associatewriter for any signs of escalation.     Pt. Does not want to participate in groups and refuses to attend.  He agrees to contract for safety and has remained safe  Today.  ---A ---  Support, safety cks, and encouragement provided.  ---  R  --  Pt. Remains safe in room

## 2014-12-07 NOTE — Progress Notes (Signed)
.  Psychoeducational Group Note    Date: 12/07/2014 Time:  0930    Goal Setting Purpose of Group: To be able to set a goal that is measurable and that can be accomplished in one day Participation Level: did not attend  Dantre Yearwood A 

## 2014-12-07 NOTE — Plan of Care (Signed)
Problem: Alteration in thought process Goal: STG-Patient does not respond to command hallucinations Outcome: Progressing Pt denies AVH and any command hallucination.

## 2014-12-07 NOTE — Progress Notes (Signed)
Psychoeducational Group Note  Date:  12/07/2014 Time:  2100  Group Topic/Focus:  wrap up group  Participation Level: Did Not Attend  Participation Quality:  Not Applicable  Affect:  Not Applicable  Cognitive:  Not Applicable  Insight:  Not Applicable  Engagement in Group: Not Applicable  Additional Comments:  Pt was notified that group was beginning but remained in his bed.   Shelah LewandowskySquires, Rayetta Veith Carol 12/07/2014, 10:50 PM

## 2014-12-07 NOTE — Progress Notes (Signed)
D   Pt is irritable on approach  He complains about not getting enough medication   He said out on the street he takes 600 mg of thorazine daily but here he is only getting 400 mg daily   He complained about some of his other medications    A   Verbal support given   Medications administered and effectiveness monitored   Encouraged pt to talk to his doctor tomorrow   He said to be sure and wake him up  Then he was encouraged to be up in the morning ready to talk to the doctor and also encouraged  Him to make a list of things he wanted to talk about   Q 15 min checks R   Pt was minimally receptive to suggestion and just kept complaining everytime he was offered a solution   Pt is safe

## 2014-12-07 NOTE — Progress Notes (Signed)
Patient ID: Harold Brooks Feenstra, male   DOB: 08/23/1966, 49 y.o.   [id number] [facility]  12/07/2014 1:50 PM Harold Brooks Chastang  MRN:  [id number]  Subjective:  Antionette CharHayward says he is still having hard time. Complains of hearing voices, which he says is an ongoing problems. However, says he can easily ignore the voices if something interesting is going on. He describes his sleep pattern as fair. He denies any SIHI, VH. He did not participate in the group sessions today. Says he does not like to be in a room full of people.  Principal Problem: Severe bipolar affective disorder with psychosis Diagnosis:   Patient Active Problem List   Diagnosis Date Noted  . Substance induced mood disorder [F19.94] 12/05/2014  . Severe bipolar affective disorder with psychosis [F31.89] 12/05/2014  . 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 PTSD (post-traumatic stress disorder) [F43.10] 01/13/2013  . Cocaine abuse [F14.10] 01/13/2013   Total Time spent with patient: 30 minutes   Past Medical History:  Past Medical History  Diagnosis Date  . Kidney stones   . Schizophrenia    History reviewed. No pertinent past surgical history.  Family History: History reviewed. No pertinent family history.  Social History:  History  Alcohol Use No     History  Drug Use  . Yes  . Special: Cocaine, Marijuana, Benzodiazepines    History   Social History  . Marital Status: Unknown    Spouse Name: N/A  . Number of Children: N/A  . Years of Education: N/A   Social History Main Topics  . Smoking status: Current Every Day Smoker    Types: Cigarettes  . Smokeless tobacco: Not on file  . Alcohol Use: No  . Drug Use: Yes    Special: Cocaine, Marijuana, Benzodiazepines  . Sexual Activity: Yes    Birth Control/ Protection: None   Other Topics Concern  . None   Social History Narrative   Additional History:    Sleep: Fair  Appetite:  Fair  Assessment: Objective: Ptient is seen and  chart is reviewed. Patient continues to endorse ongoing anxiety, depressive symptoms, anxiety, difficulty sleeping, panic attacks and nightmares. He is rating his anxiety and  depression at 610 today. However, he is denies suicidal thoughts and ongoing hallucinations. Patient is requesting for his medications to be adjusted to address his current symptoms (panic attacks). He is compliant with his current medications and has not verbalized any adverse reactions.  Musculoskeletal: Strength & Muscle Tone: within normal limits Gait & Station: normal Patient leans: N/A  Psychiatric Specialty Exam: Physical Exam  ROS  Blood pressure 112/76, pulse 115, temperature 98 F (36.7 C), temperature source Oral, resp. rate 16, height 6\' 1"  (1.854 m), weight 82.101 kg (181 lb).Body mass index is 23.89 kg/(m^2).  General Appearance: Disheveled  Eye Contact::  Minimal  Speech:  Clear and Coherent and Slow  Volume:  Decreased  Mood:  Anxious, Depressed and Irritable  Affect:  Restricted  Thought Process:  Coherent and Goal Directed  Orientation:  Other:  person place  Thought Content:  symptoms events worries concerns  Suicidal Thoughts:  "still have thoughts" no plan or intent  Homicidal Thoughts:  No  Memory:  Immediate;   Fair Recent;   Fair Remote;   Fair  Judgement:  Fair  Insight:  Shallow  Psychomotor Activity:  Restlessness  Concentration:  Poor  Recall:  Poor  Fund of Knowledge:Fair  Language: Fair  Akathisia:  No  Handed:  Right  AIMS (if indicated):     Assets:  Desire for Improvement Housing Social Support  ADL's:  Intact  Cognition: WNL  Sleep:  Number of Hours: 6.25   Current Medications: Current Facility-Administered Medications  Medication Dose Route Frequency Provider Last Rate Last Dose  . acetaminophen (TYLENOL) tablet 650 mg  650 mg Oral Q6H PRN Kerry Hough, PA-C      . alum & mag hydroxide-simeth (MAALOX/MYLANTA) 200-200-20 MG/5ML suspension 30 mL  30 mL Oral Q4H  PRN Kerry Hough, PA-C      . chlorproMAZINE (THORAZINE) tablet 200 mg  200 mg Oral BID Rachael Fee, MD   200 mg at 12/07/14 0803  . clonazePAM (KLONOPIN) tablet 1.5 mg  1.5 mg Oral BID PRN Rachael Fee, MD   1.5 mg at 12/07/14 1346  . feeding supplement (ENSURE ENLIVE) (ENSURE ENLIVE) liquid 237 mL  237 mL Oral BID BM Tilda Franco, RD   237 mL at 12/07/14 1000  . gabapentin (NEURONTIN) capsule 300 mg  300 mg Oral TID Rachael Fee, MD   300 mg at 12/07/14 1200  . magnesium hydroxide (MILK OF MAGNESIA) suspension 30 mL  30 mL Oral Daily PRN Kerry Hough, PA-C      . pantoprazole (PROTONIX) EC tablet 40 mg  40 mg Oral Daily Kerry Hough, PA-C   40 mg at 12/07/14 0804  . QUEtiapine (SEROQUEL) tablet 300 mg  300 mg Oral QHS Rachael Fee, MD   300 mg at 12/06/14 2124    Lab Results:  Results for orders placed or performed during the hospital encounter of 12/04/14 (from the past 48 hour(s))  Prolactin     Status: Abnormal   Collection Time: 12/05/14  7:35 PM  Result Value Ref Range   Prolactin 38.9 (H) 4.0 - 15.2 ng/mL    Comment: (NOTE) Performed At: [facility] [address] [place], [place] [id number] Mila Homer MD WU:[id number] Performed at [facility]   Hemoglobin A1c     Status: None   Collection Time: 12/06/14  6:40 AM  Result Value Ref Range   Hgb A1c MFr Bld 5.6 4.8 - 5.6 %    Comment: (NOTE)         Pre-diabetes: 5.7 - 6.4         Diabetes: >6.4         Glycemic control for adults with diabetes: <7.0    Mean Plasma Glucose 114 mg/dL    Comment: (NOTE) Performed At: [facility] [address] [place], [place] [id number] Mila Homer MD VH:[id number] Performed at [facility]   Lipid panel     Status: Abnormal   Collection Time: 12/06/14  6:40 AM  Result Value Ref Range   Cholesterol 146 0 - 200 mg/dL   Triglycerides 413 (H) <150 mg/dL   HDL 40 >24 mg/dL   Total CHOL/HDL Ratio 3.7 RATIO    VLDL 34 0 - 40 mg/dL   LDL Cholesterol 72 0 - 99 mg/dL    Comment:        Total Cholesterol/HDL:CHD Risk Coronary Heart Disease Risk Table                     Men   Women  1/2 Average Risk   3.4   3.3  Average Risk       5.0   4.4  2 X Average Risk   9.6  7.1  3 X Average Risk  23.4   11.0        Use the calculated Patient Ratio above and the CHD Risk Table to determine the patient's CHD Risk.        ATP III CLASSIFICATION (LDL):  <100     mg/dL   Optimal  161-096  mg/dL   Near or Above                    Optimal  130-159  mg/dL   Borderline  045-409  mg/dL   High  >811     mg/dL   Very High Performed at [facility]     Physical Findings: AIMS: Facial and Oral Movements Muscles of Facial Expression: None, normal Lips and Perioral Area: None, normal Jaw: None, normal Tongue: None, normal,Extremity Movements Upper (arms, wrists, hands, fingers): None, normal Lower (legs, knees, ankles, toes): None, normal, Trunk Movements Neck, shoulders, hips: None, normal, Overall Severity Severity of abnormal movements (highest score from questions above): None, normal Incapacitation due to abnormal movements: None, normal Patient's awareness of abnormal movements (rate only patient's report): No Awareness, Dental Status Current problems with teeth and/or dentures?: Yes ("couple cavities") Does patient usually wear dentures?: No  CIWA:  CIWA-Ar Total: 0 COWS:     Treatment Plan Summary: Daily contact with patient to assess and evaluate symptoms and progress in treatment and Medication management Supportive approach/coping skills Bipolar Disorder; optimize response to the Thorazine/Seroquel combination PTSD; pursue further trauma work Cocaine abuse; continue to monitor mood instability coming from withdrawal Benzodiazepine dependence: will start weaning off the Klonopin. Will decrease to 1.5 mg BID PRN with plans to decrease to 1 mg BID PRN by Sunday Will work a relapse  prevention plan Will use CBT/mindfulness to deal with the panic/anxiety as he comes off the Klonopin Explore residential treatment options for further substance abuse treatment.  Medical Decision Making:  Review of Psycho-Social Stressors (1), Review of Medication Regimen & Side Effects (2) and Review of New Medication or Change in Dosage (2)  Sanjuana Kava, PMHNP, FNP-BC 12/07/2014, 1:50 PM I agreed with findings and treatment plan of this patient

## 2014-12-07 NOTE — Progress Notes (Signed)
Psychoeducational Group Note  Date: 12/07/2014 Time:  1015  Group Topic/Focus:  Identifying Needs:   The focus of this group is to help patients identify their personal needs that have been historically problematic and identify healthy behaviors to address their needs.  Participation Level:  Did not attend   Harold Brooks A  

## 2014-12-08 DIAGNOSIS — F29 Unspecified psychosis not due to a substance or known physiological condition: Secondary | ICD-10-CM

## 2014-12-08 DIAGNOSIS — F1410 Cocaine abuse, uncomplicated: Secondary | ICD-10-CM

## 2014-12-08 DIAGNOSIS — F319 Bipolar disorder, unspecified: Principal | ICD-10-CM

## 2014-12-08 DIAGNOSIS — F4310 Post-traumatic stress disorder, unspecified: Secondary | ICD-10-CM

## 2014-12-08 NOTE — Progress Notes (Addendum)
Patient ID: Harold Brooks, male   DOB: 08/23/1966, 49 y.o.   MRN: [id number] [facility] MD Progress Note  12/08/2014 10:26 AM Harold Brooks  MRN:  [id number]  Subjective:  Harold Brooks says he is still having hard time. Complains of tapering dose of klonopine. Says you guys dont know me . Shows poor insight about his drug use or cocaine. Explained the high dose of klonopine and its dependence concerns but he got irritated about it. He describes his sleep pattern as fair. He denies any SIHI, VH. He did not participate in the group sessions today. Says he does not like to be in a room full of people.  Principal Problem: Severe bipolar affective disorder with psychosis Diagnosis:   Patient Active Problem List   Diagnosis Date Noted  . Substance induced mood disorder [F19.94] 12/05/2014  . Severe bipolar affective disorder with psychosis [F31.89] 12/05/2014  . 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 PTSD (post-traumatic stress disorder) [F43.10] 01/13/2013  . Cocaine abuse [F14.10] 01/13/2013   Total Time spent with patient: 25 minutes.   Past Medical History:  Past Medical History  Diagnosis Date  . Kidney stones   . Schizophrenia    History reviewed. No pertinent past surgical history.  Family History: History reviewed. No pertinent family history.  Social History:  History  Alcohol Use No     History  Drug Use  . Yes  . Special: Cocaine, Marijuana, Benzodiazepines    History   Social History  . Marital Status: Unknown    Spouse Name: N/A  . Number of Children: N/A  . Years of Education: N/A   Social History Main Topics  . Smoking status: Current Every Day Smoker    Types: Cigarettes  . Smokeless tobacco: Not on file  . Alcohol Use: No  . Drug Use: Yes    Special: Cocaine, Marijuana, Benzodiazepines  . Sexual Activity: Yes    Birth Control/ Protection: None   Other Topics Concern  . None   Social History Narrative   Additional History:    Sleep:  Fair  Appetite:  Fair  Assessment: Objective: Ptient is seen and chart is reviewed. Patient continues to endorse ongoing anxiety, depressive symptoms, anxiety, difficulty sleeping, panic attacks and nightmares. He is rating his anxiety and  depression at 610 today. However, he is denies suicidal thoughts and ongoing hallucinations. Patient is requesting for his medications to be adjusted to address his current symptoms (panic attacks). He is compliant with his current medications and has not verbalized any adverse reactions.  Musculoskeletal: Strength & Muscle Tone: within normal limits Gait & Station: normal Patient leans: N/A  Psychiatric Specialty Exam: Physical Exam  ROS  Blood pressure 106/75, pulse 120, temperature 97.9 F (36.6 C), temperature source Oral, resp. rate 24, height  (1.854 m), weight 82.101 kg (181 lb).Body mass index is 23.89 kg/(m^2).  General Appearance: Disheveled  Eye Contact::  Minimal  Speech:  Clear and Coherent and Slow  Volume:  Decreased  Mood:  Anxious, Depressed and Irritable  Affect:  Restricted  Thought Process:  Coherent and Goal Directed  Orientation:  Other:  person place  Thought Content:  symptoms events worries concerns  Suicidal Thoughts:  "still have thoughts" no plan or intent  Homicidal Thoughts:  No  Memory:  Immediate;   Fair Recent;   Fair Remote;   Fair  Judgement:  Fair  Insight:  Shallow  Psychomotor Activity:  Restlessness  Concentration:  Poor  Recall:  Poor  Fund of Knowledge:Fair  Language: Fair  Akathisia:  No  Handed:  Right  AIMS (if indicated):     Assets:  Desire for Improvement Housing Social Support  ADL's:  Intact  Cognition: WNL  Sleep:  Number of Hours: 5.5   Current Medications: Current Facility-Administered Medications  Medication Dose Route Frequency Provider Last Rate Last Dose  . acetaminophen (TYLENOL) tablet 650 mg  650 mg Oral Q6H PRN Kerry HoughSpencer E Simon, PA-C      . alum & mag  hydroxide-simeth (MAALOX/MYLANTA) 200-200-20 MG/5ML suspension 30 mL  30 mL Oral Q4H PRN Kerry HoughSpencer E Simon, PA-C      . chlorproMAZINE (THORAZINE) tablet 200 mg  200 mg Oral BID Rachael FeeIrving A Lugo, MD   200 mg at 12/08/14 0941  . clonazePAM (KLONOPIN) tablet 1.5 mg  1.5 mg Oral BID PRN Rachael FeeIrving A Lugo, MD   1.5 mg at 12/08/14 0946  . feeding supplement (ENSURE ENLIVE) (ENSURE ENLIVE) liquid 237 mL  237 mL Oral BID BM Tilda FrancoLindsey Baker, RD   237 mL at 12/08/14 0945  . gabapentin (NEURONTIN) capsule 300 mg  300 mg Oral TID Rachael FeeIrving A Lugo, MD   300 mg at 12/08/14 0944  . magnesium hydroxide (MILK OF MAGNESIA) suspension 30 mL  30 mL Oral Daily PRN Kerry HoughSpencer E Simon, PA-C      . pantoprazole (PROTONIX) EC tablet 40 mg  40 mg Oral Daily Kerry HoughSpencer E Simon, PA-C   40 mg at 12/08/14 0941  . QUEtiapine (SEROQUEL) tablet 300 mg  300 mg Oral QHS Rachael FeeIrving A Lugo, MD   300 mg at 12/07/14 2039    Lab Results:  No results found for this or any previous visit (from the past 48 hour(s)).  Physical Findings: AIMS: Facial and Oral Movements Muscles of Facial Expression: None, normal Lips and Perioral Area: None, normal Jaw: None, normal Tongue: None, normal,Extremity Movements Upper (arms, wrists, hands, fingers): None, normal Lower (legs, knees, ankles, toes): None, normal, Trunk Movements Neck, shoulders, hips: None, normal, Overall Severity Severity of abnormal movements (highest score from questions above): None, normal Incapacitation due to abnormal movements: None, normal Patient's awareness of abnormal movements (rate only patient's report): No Awareness, Dental Status Current problems with teeth and/or dentures?: Yes ("couple cavities") Does patient usually wear dentures?: No  CIWA:  CIWA-Ar Total: 0 COWS:     Treatment Plan Summary: Daily contact with patient to assess and evaluate symptoms and progress in treatment and Medication management Supportive approach/coping skills Bipolar Disorder; optimize response to  the Thorazine/Seroquel combination PTSD; pursue further trauma work Cocaine abuse; continue to monitor mood instability coming from withdrawal Benzodiazepine dependence:  Irritated that Klonopine is being tapered. Says he was on 2mg  tid.  Will work a relapse prevention plan Will use CBT/mindfulness to deal with the panic/anxiety as he comes off the Klonopin Explore residential treatment options for further substance abuse treatment.  Medical Decision Making:  Review of Psycho-Social Stressors (1), Review of Medication Regimen & Side Effects (2) and Review of New Medication or Change in Dosage (2)  Gilmore LarocheAKHTAR, Marcelyn Ruppe, MD  12/08/2014, 10:26 AM

## 2014-12-08 NOTE — [facility] Group Notes (Signed)
[facility] Group Notes:  (Nursing/MHT/Case Management/Adjunct)  Date:  12/08/2014  Time:  1015  Type of Therapy:  Nurse Education  Participation Level:  Did Not Attend  Participation Quality:      Affect:     Cognitive:    Insight:    Engagement in Group:    Modes of Intervention:    Summary of Progress/Problems: patient was invited to attend group however elected to remain in bed.  Harold MarseillesFriedman, [name] 12/08/2014, 3:06 PM

## 2014-12-08 NOTE — [facility] Group Notes (Signed)
[facility] Group Notes:  (Nursing/MHT/Case Management/Adjunct)  Date:  12/08/2014  Time:  10:08 AM  Type of Therapy:  Psychoeducational Skills  Participation Level:  Did Not Attend  Participation Quality:  Did Not Attend  Affect:  Did Not Attend  Cognitive:  Did Not Attend  Insight:  None  Engagement in Group:  Did Not Attend  Modes of Intervention:  Did Not Attend  Summary of Progress/Problems: Pt did not attend patient self inventory group.    Harold Brooks 12/08/2014, 10:08 AM 

## 2014-12-08 NOTE — Plan of Care (Signed)
Problem: Alteration in thought process Goal: STG-Patient is able to discuss thoughts with staff Outcome: Not Progressing Patient irritable and uncooperative. Blaming others.  Problem: Alteration in mood & ability to function due to Goal: STG-Patient will attend groups Outcome: Not Progressing Patient is refusing groups and elects to stay in bed much of the day.

## 2014-12-08 NOTE — Progress Notes (Signed)
Patient did not attend the evening speaker AA meeting. Pt exited the meeting shortly after it began and requested anxiety medicine from nurse. Pt spoke with nurse for about 15 min and them returned to his room.

## 2014-12-08 NOTE — [facility] Group Notes (Signed)
[facility] Group Notes: (Clinical Social Work)   12/08/2014      Type of Therapy:  Group Therapy   Participation Level:  Did Not Attend despite MHT prompting   Ambrose MantleMareida Grossman-Orr, LCSW 12/08/2014, 2:42 PM

## 2014-12-08 NOTE — Progress Notes (Signed)
Patient asleep and awakened for morning medications. Patient quite irritable stating, "Why are you waking me? They've been bringing me my meds." Patient avoids eye contact. Encouraged patient to get up for the day, encouraged to participate in programming. Explained these are the expectations of staff and will serve him best for his recovery. Patient angry, still refusing to get up stating, "you all think you know everything about a person but you don't." Asked patient what he would like to convey to staff/MD but patient just shook head and laid head back on pillow. Due to patient's agitation, meds were brought to him along with klonopin per his request. "I should be getting that three times a day. That's how I take it on the street." Will continue to encourage and redirect. Patient denies pain, physical problems. No SI/HI/AVH. Lawrence MarseillesFriedman, Braxton Vantrease Eakes

## 2014-12-08 NOTE — [facility] Group Notes (Signed)
[facility] Group Notes:  (Nursing/MHT/Case Management/Adjunct)  Date:  12/08/2014  Time:  9:30am  Type of Therapy:  Nurse Education  Participation Level:  Did Not Attend  Participation Quality:      Affect:    Cognitive:    Insight:    Engagement in Group:    Modes of Intervention:    Summary of Progress/Problems: Patient was invited and strongly encouraged to attend however declined.  Lawrence MarseillesFriedman, Meleana Commerford Eakes 12/08/2014, 10:33 AM

## 2014-12-09 MED ORDER — GABAPENTIN 300 MG PO CAPS: 300.0000 mg | ORAL_CAPSULE | Freq: Three times a day (TID) | ORAL | Status: DC

## 2014-12-09 MED ORDER — QUETIAPINE FUMARATE 300 MG PO TABS: 300.0000 mg | ORAL_TABLET | Freq: Every day | ORAL | Status: DC

## 2014-12-09 MED ORDER — CLONAZEPAM 2 MG PO TABS: 2.0000 mg | ORAL_TABLET | Freq: Three times a day (TID) | ORAL | Status: DC

## 2014-12-09 MED ORDER — CHLORPROMAZINE HCL 200 MG PO TABS: 200.0000 mg | ORAL_TABLET | Freq: Two times a day (BID) | ORAL | Status: AC

## 2014-12-09 MED ORDER — PANTOPRAZOLE SODIUM 40 MG PO TBEC: 40.0000 mg | DELAYED_RELEASE_TABLET | Freq: Every day | ORAL | Status: AC

## 2014-12-09 NOTE — [facility] Suicide Risk Assessment (Signed)
[facility] Discharge Suicide Risk Assessment   Demographic Factors:  Male and Caucasian  Total Time spent with patient: 30 minutes  Musculoskeletal: Strength & Muscle Tone: within normal limits Gait & Station: normal Patient leans: N/A  Psychiatric Specialty Exam: Physical Exam  Review of Systems  Constitutional: Negative.   HENT: Negative.   Eyes: Negative.   Respiratory: Negative.   Cardiovascular: Negative.   Gastrointestinal: Negative.   Genitourinary: Negative.   Musculoskeletal: Negative.   Skin: Negative.   Neurological: Negative.   Endo/Heme/Allergies: Negative.   Psychiatric/Behavioral: Positive for substance abuse. The patient is nervous/anxious.     Blood pressure 113/87, pulse 99, temperature 98 F (36.7 C), temperature source Oral, resp. rate 20, height  (1.854 m), weight 82.101 kg (181 lb).Body mass index is 23.89 kg/(m^2).  General Appearance: Fairly Groomed  Patent attorney::  Fair  Speech:  Clear and Coherent  Volume:  fluctuates  Mood:  Anxious  Affect:  Restricted  Thought Process:  Coherent and Goal Directed  Orientation:  Full (Time, Place, and Person)  Thought Content:  deals with plans as he moves on, wants to go to [place] for 90 days  Suicidal Thoughts:  No  Homicidal Thoughts:  No  Memory:  Immediate;   Fair Recent;   Fair Remote;   Fair  Judgement:  Intact  Insight:  Present and Shallow  Psychomotor Activity:  Restlessness  Concentration:  Fair  Recall:  [employer] of Knowledge:Fair  Language: Fair  Akathisia:  No  Handed:  Right  AIMS (if indicated):     Assets:  Desire for Improvement Social Support  Sleep:  Number of Hours: 5.5  Cognition: WNL  ADL's:  Intact   Have you used any form of tobacco in the last 30 days? (Cigarettes, Smokeless Tobacco, Cigars, and/or Pipes): Yes  Has this patient used any form of tobacco in the last 30 days? (Cigarettes, Smokeless Tobacco, Cigars, and/or Pipes) Yes, A prescription for an  FDA-approved tobacco cessation medication was offered at discharge and the patient refused  Mental Status Per Nursing Assessment::   On Admission:  Suicidal ideation indicated by patient, Self-harm thoughts  Current Mental Status by Physician: States that he is ready to be D/C. There are no active S/S of withdrawal. He is in full contact with reality. There are no active SI plans or intent. He is planning to go to [place]. He is wanting to stay on the Klonopin so if he is not able to stay on the Klonopin he will go somewhere else.    Loss Factors: NA  Historical Factors: Impulsivity  Risk Reduction Factors:   Positive social support  Continued Clinical Symptoms:  Severe Anxiety and/or Agitation Bipolar Disorder:   Depressive phase Alcohol/Substance Abuse/Dependencies  Cognitive Features That Contribute To Risk:  Closed-mindedness, Polarized thinking and Thought constriction (tunnel vision)    Suicide Risk:  Minimal: No identifiable suicidal ideation.  Patients presenting with no risk factors but with morbid ruminations; may be classified as minimal risk based on the severity of the depressive symptoms  Principal Problem: Severe bipolar affective disorder with psychosis Discharge Diagnoses:  Patient Active Problem List   Diagnosis Date Noted  . Substance induced mood disorder [F19.94] 12/05/2014  . Severe bipolar affective disorder with psychosis [F31.89] 12/05/2014  . 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 PTSD (post-traumatic stress disorder) [F43.10] 01/13/2013  . Cocaine abuse [F14.10] 01/13/2013    Follow-up Information    [facility] Lynett Fish-  [facility] On 12/16/2014.   Why:  Appointment with Dr. Lynett FishSu Hansen on Monday April 18th at 1:40pm . Please call office if you need to reschedule.   Contact information:   [address] [place], [place] [address] [phone]  Fax [phone]      Plan Of  Care/Follow-up recommendations:  Activity:  as tolerated  Diet:  regular Follow up Dr. Lynett FishSu Hansen and [facility] Is patient on multiple antipsychotic therapies at discharge:  No   Has Patient had three or more failed trials of antipsychotic monotherapy by history:  No  Recommended Plan for Multiple Antipsychotic Therapies: NA    Waverly Chavarria A 12/09/2014, 1:32 PM

## 2014-12-09 NOTE — [facility] Group Notes (Signed)
[facility] LCSW Group Therapy 12/09/2014  1:15 PM   Type of Therapy: Group Therapy  Participation Level: Did Not Attend. Patient invited to participate but declined.   Samuella BruinKristin Norabelle Kondo, MSW, [employer]CSWA Clinical Social Worker [facility] [phone]

## 2014-12-09 NOTE — [facility] Suicide Risk Assessment (Signed)
[facility] INPATIENT:  Family/Significant Other Suicide Prevention Education  Suicide Prevention Education:  Patient Refusal for Family/Significant Other Suicide Prevention Education: The patient Harold Brooks has refused to provide written consent for family/significant other to be provided Family/Significant Other Suicide Prevention Education during admission and/or prior to discharge.  Physician notified. SPE reviewed with patient and brochure provided. Patient encouraged to return to hospital if having suicidal thoughts, patient verbalized his/her understanding and has no further questions at this time.   Journi Moffa, [place] CarboKristin L 12/09/2014, 10:57 AM

## 2014-12-09 NOTE — Progress Notes (Signed)
  [facility] :  Will you be returning to the same living situation after discharge:  Yes,  patient plans to return home with his family At discharge, do you have transportation home?: Yes,  patient reports that his cousin will provide transportation Do you have the ability to pay for your medications: Yes,  patient will be provided with prescriptions at discharge  Release of information consent forms completed and in the chart;  Patient's signature needed at discharge.  Patient to Follow up at: Follow-up Information    [facility]Su [facility]- [facility] On 12/16/2014.   Why:  Appointment with Dr. Lynett FishSu Hansen on Monday April 18th at 1:40pm . Please call office if you need to reschedule.   Contact information:   [address] [place], [place] [address] [phone]  Fax [phone]      Patient denies SI/HI: Yes,  denies    Safety Planning and Suicide Prevention discussed: Yes,  with patient  Have you used any form of tobacco in the last 30 days? (Cigarettes, Smokeless Tobacco, Cigars, and/or Pipes): Yes  Has patient been referred to the Quitline?: Patient refused referral  Arilla Hice, [place] CarboKristin L 12/09/2014, 11:00 AM

## 2014-12-09 NOTE — Progress Notes (Signed)
D: Pt denies SI/HI, denies hallucinations. Pt did not attend evening AA meeting. States, "Those meetings never do me any good."  A: Introduced self to pt. Supported and encouraged pt to attend group. Medications administered per order.   R: Pt did not attend group despite encouragement. Pt is compliant with medications. Pt verbally contracts for safety.      Will continue to monitor and assess.

## 2014-12-09 NOTE — [facility] Group Notes (Signed)
[facility]  12/09/2014  8:45 AM  Participation Quality: Did Not Attend. Patient invited to participate but declined.  Samuella BruinKristin Abigail Teall, MSW, [employer]CSWA Clinical Social Worker [facility] [phone]

## 2014-12-09 NOTE — Clinical Social Work Note (Signed)
Referral sent to [facility] for review.  Samuella BruinKristin Motty Borin, MSW, [employer]CSWA Clinical Social Worker [facility] [phone]

## 2014-12-09 NOTE — Progress Notes (Signed)
Pt was very agitated and extremely annoyed with the nurse  going over his discharge instruction.s Pt refused to take any of his RX as stated," I need a rx for clonipine 2mg  three times a day." "If I am not getting that I do not need any of this shit. Take it all back." NP made aware . Pt refused to take a survey but did take his discharge instructions. He was also given his lab values and told to make sure he takes them to his doctor. Pt stated,"I ain't got no doctor." Informed pt we could give him referrals to a general internist where he responded," I don't live around here and I think I can find a doctor when I need one." Pt refused to fill out his self inventory and wanted just to sleep. He appears very dishelved and stated,"I have been to treatment 13 times and I ain't getting any help for what I need. I ain't answering any of your dumb questions about if I want to hurt myself." Pt went back into his room and went to bed. He stated he would leave here today b/t 5-6pm. Pt refused to tell the nurse if he had a ride home or not. Nurse, Maureen RalphsVivian , made aware. NP and social worker made aware of the nurse encounter with the pt.

## 2014-12-09 NOTE — Discharge Summary (Signed)
Physician Discharge Summary Note  Patient:  Harold Brooks is an 49 y.o., male  MRN:  [id number]  DOB:  02-Sep-1965  Patient phone:  [phone] (home)   Patient address:   [address] [place] [address],   Total Time spent with patient: Greater than 30 minutes  Date of Admission:  12/04/2014  Date of Discharge: 12/09/14  Reason for Admission:  Psychosis, Cocaine binge  Principal Problem: Severe bipolar affective disorder with psychosis Discharge Diagnoses: Patient Active Problem List   Diagnosis Date Noted  . Substance induced mood disorder [F19.94] 12/05/2014  . Severe bipolar affective disorder with psychosis [F31.89] 12/05/2014  . 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 PTSD (post-traumatic stress disorder) [F43.10] 01/13/2013  . Cocaine abuse [F14.10] 01/13/2013   Musculoskeletal: Strength & Muscle Tone: within normal limits Gait & Station: normal Patient leans: N/A  Psychiatric Specialty Exam: Physical Exam  Psychiatric: He has a normal mood and affect. His speech is normal. Judgment and thought content normal. He is not actively hallucinating (Hx of ). Cognition and memory are normal. He expresses no homicidal and no suicidal ideation.    Review of Systems  Constitutional: Negative.   HENT: Negative.   Eyes: Negative.   Respiratory: Negative.   Cardiovascular: Negative.   Gastrointestinal: Negative.   Genitourinary: Negative.   Musculoskeletal: Negative.   Skin: Negative.   Neurological: Negative.   Endo/Heme/Allergies: Negative.   Psychiatric/Behavioral: Positive for depression (Stable), hallucinations (Stable) and substance abuse (Hx of). Negative for suicidal ideas and memory loss. The patient has insomnia (Stable). The patient is not nervous/anxious.     Blood pressure 113/87, pulse 99, temperature 98 F (36.7 C), temperature source Oral, resp. rate 20, height  (1.854 m), weight 82.101 kg (181 lb).Body mass index is 23.89  kg/(m^2).  See Md's suicide risks assessment   Past Medical History:  Past Medical History  Diagnosis Date  . Kidney stones   . Schizophrenia    History reviewed. No pertinent past surgical history.  Family History: History reviewed. No pertinent family history.  Social History:  History  Alcohol Use No     History  Drug Use  . Yes  . Special: Cocaine, Marijuana, Benzodiazepines    History   Social History  . Marital Status: Unknown    Spouse Name: N/A  . Number of Children: N/A  . Years of Education: N/A   Social History Main Topics  . Smoking status: Current Every Day Smoker    Types: Cigarettes  . Smokeless tobacco: Not on file  . Alcohol Use: No  . Drug Use: Yes    Special: Cocaine, Marijuana, Benzodiazepines  . Sexual Activity: Yes    Birth Control/ Protection: None   Other Topics Concern  . None   Social History Narrative   Risk to Self: Is patient at risk for suicide?: Yes  Risk to Others: No  Prior Inpatient Therapy: No  Prior Outpatient Therapy: No  Level of Care:  OP  Hospital Course: 49 Y/o male who states that he hears voices that tell him to kill himself or hurt other people. States that this has been going on for 20 years. States that his life style is affecting him. States he went into a cocaine binge. States this is affecting his relationship with his son. He is having thoughts of suicide. Has tried to kill himself three times in the past. States his son OD last Friday was in ICU, he made it OK. States he  asked God to take him instead of his son. He could not deal with it, went on a cocaine binge. The cocaine he admits makes the voices worst but they are there even when he is not using.  During the course of his hospitalization, Harold Brooks was evaluated/assessed, his symptoms were identified. Medication management were initiated targeting his presenting symptoms. He was medicated & discharged on; Thorazine 200 mg for mood control, Klonopin 1.5 mg  for severe anxiety symptoms, Neurontin 300 mg for agitation & Seroquel 300 mg for mood control. He also received other medication managment for the other medical issues that he presented. He also was enrolled and participated in the Group counseling sessions being offered and held on this unit. Harold Brooks learned coping skills that should help him cope better and maintain sobriety/mood stability after discharge.   Harold Brooks is curently being discharged to his home as his mood has stabilized. However, he is going home on 2 separate antipsychotic medication therapies (Thorazine & Seroquel) because his symptoms did not respond well under an antipsychotic monotherapy.The failed antipsychotic monotherapies include; Haldol, Seroquel and Thorazine.  However, a combination therapy of Thorazine and Seroquel seem to have helped improve his symptoms. As a result, it is necessary for [facility] to continue on these combination therapies to achieve maximum symptom control after discharge. However, in the event that his symptoms happen to improve or decrease, he can then be titrated down to an antipsychotic monotherapy to avoid risks of metabolic syndrome and other related adverse effects.This has to be done within the proper evaluation and judgement of his outpatient provider.   Harold Brooks is being discharged to his home with family. He will follow-up care for routine psychiatric treatment & medication management at [facility] with Dr. Janeece RiggersSu. He is provided with all the necessary information required to make this appointment without problems. Upon discharge, he adamantly denies any SIHI, AVH, delusional thoughts and or paranoia. He was provided with a 4 days worth, supply samples of [facility]. [facility] in no distress. Transportation per cousin.   Consults:  psychiatry  Significant Diagnostic Studies:  labs: CBC with diff, CMP, UDS, toxicology tests, U/A, results  reviewed, stable  Discharge Vitals:   Blood pressure 113/87, pulse 99, temperature 98 F (36.7 C), temperature source Oral, resp. rate 20, height 6\' 1"  (1.854 m), weight 82.101 kg (181 lb). Body mass index is 23.89 kg/(m^2). Lab Results:   No results found for this or any previous visit (from the past 72 hour(s)).  Physical Findings: AIMS: Facial and Oral Movements Muscles of Facial Expression: None, normal Lips and Perioral Area: None, normal Jaw: None, normal Tongue: None, normal,Extremity Movements Upper (arms, wrists, hands, fingers): None, normal Lower (legs, knees, ankles, toes): None, normal, Trunk Movements Neck, shoulders, hips: None, normal, Overall Severity Severity of abnormal movements (highest score from questions above): None, normal Incapacitation due to abnormal movements: None, normal Patient's awareness of abnormal movements (rate only patient's report): No Awareness, Dental Status Current problems with teeth and/or dentures?: Yes ("couple cavities") Does patient usually wear dentures?: No  CIWA:  CIWA-Ar Total: 0 COWS:     See Psychiatric Specialty Exam and Suicide Risk Assessment completed by Attending Physician prior to discharge.  Discharge destination:  Home  Is patient on multiple antipsychotic therapies at discharge:  Yes,    Do you recommend tapering to monotherapy for antipsychotics?  Yes    Has Patient had three or more failed trials of antipsychotic  monotherapy by history:  Yes,   Antipsychotic medications that previously failed include:   1.  Haldol, ., 2.  Seroquel. and 3.  Thorazine.  Recommended Plan for Multiple Antipsychotic Therapies: And because patient has not been able to achieve symptoms control under an antipsychotic monotherapy, he is currently receiving and being discharged on 2 separate antipsychotic medications Thorazine & Seroquel which seem effective at this time. It will benefit patient to continue on these two combination  antipsychotic therapies as recommended. However, as symptoms continue to improve, patient may be titrated down to an antipsychotic monotherapy. This has to be done witnin the discretion and proper judgement of his outpatient provider.     Discharge Instructions    Discharge instructions    Complete by:  As directed   See copy of most recent lab reports showing elevated Prolactin levels in reference to the use of 2 separate antipsychotic.     Discharge instructions    Complete by:  As directed   And because patient has not been able to achieve symptoms control under an antipsychotic monotherapy, he is currently receiving and being discharged on 2 separate antipsychotic medications Thorazine & Seroquel which seem effective at this time. It will benefit patient to continue on these two combination antipsychotic therapies as recommended. However, as symptoms continue to improve, patient may be titrated to an antipsychotic monotherapy. This has to be done to the discretion and proper judgement of his outpatient provider.            Medication List    STOP taking these medications        benztropine 1 MG tablet  Commonly known as:  COGENTIN     buPROPion 300 MG 24 hr tablet  Commonly known as:  WELLBUTRIN XL     methocarbamol 500 MG tablet  Commonly known as:  ROBAXIN     SEROQUEL XR 300 MG 24 hr tablet  Generic drug:  QUEtiapine  Replaced by:  QUEtiapine 300 MG tablet     traZODone 100 MG tablet  Commonly known as:  DESYREL      TAKE these medications      Indication   chlorproMAZINE 200 MG tablet  Commonly known as:  THORAZINE  Take 1 tablet (200 mg total) by mouth 2 (two) times daily. For mood control   Indication:  Schizophrenia, Mood control     clonazePAM 2 MG tablet  Commonly known as:  KLONOPIN  Take 1 tablet (2 mg total) by mouth 3 (three) times daily. For severe anxiety symptoms   Indication:  Severe anxiety     gabapentin 300 MG capsule  Commonly known as:   NEURONTIN  Take 1 capsule (300 mg total) by mouth 3 (three) times daily. For agitation   Indication:  Agitation     pantoprazole 40 MG tablet  Commonly known as:  PROTONIX  Take 1 tablet (40 mg total) by mouth daily. For acid reflux   Indication:  Gastroesophageal Reflux Disease, Prevent NSAID-induced gastric ulcer     QUEtiapine 300 MG tablet  Commonly known as:  SEROQUEL  Take 1 tablet (300 mg total) by mouth at bedtime. For mood control   Indication:  Schizophrenia       Follow-up Information    Follow up with Dr. Lynett Fish On 12/16/2014.   Why:  Appointment with Dr. Lynett Fish on Monday April 18th. Please call office if you need to reschedule.   Contact information:   [phone]  Follow-up recommendations:  Activity:  As tolerated Diet: As recommended by your primary care doctor. Keep all scheduled follow-up appointments as recommended.  Comments: Take all your medications as prescribed by your mental healthcare provider. Report any adverse effects and or reactions from your medicines to your outpatient provider promptly. Patient is instructed and cautioned to not engage in alcohol and or illegal drug use while on prescription medicines. In the event of worsening symptoms, patient is instructed to call the crisis hotline, 911 and or go to the nearest ED for appropriate evaluation and treatment of symptoms. Follow-up with your primary care provider for your other medical issues, concerns and or health care needs.   Total Discharge Time: Greater than 30 minutes  Signed: Sanjuana Kava, PMHNP, FNP-BC 12/09/2014, 10:42 AM  I personally assessed the patient and formulated the plan Madie Reno A. Dub Mikes, M.D.

## 2014-12-12 NOTE — Progress Notes (Signed)
Patient Discharge Instructions:  [facility] documentation was [facility].  Per the SW/ROI the patient declined to have records released.  Jerelene ReddenSheena E Lester, 12/12/2014, 2:03 PM

## 2014-12-21 NOTE — Consult Note (Signed)
Brief Consult Note: Diagnosis: Cocaine dependence, Cannabis dependence.   Patient was seen by consultant.   Consult note dictated.   Recommend further assessment or treatment.   Orders entered.   Comments: Mr. Harold Brooks has a h/o diagnosis of schizophrenia and substance abuse. Came to the hospital after cocaine binge requesting treatment.  PLAN: 1. The patient is referred to [facility].  2. I resterted medications as per Dr. Janeece RiggersSu, his psychiatrist.  3. I will follow along.  Electronic Signatures: Kristine LineaPucilowska, Taralyn Ferraiolo (MD)  (Signed [phone]-May-15 13:50)  Authored: Brief Consult Note   Last Updated: 04-May-15 13:50 by Kristine LineaPucilowska, Guadalupe Kerekes (MD)

## 2014-12-21 NOTE — Consult Note (Signed)
PATIENT NAME:  Harold Brooks, Harold Brooks MR#:  [id number] DATE OF BIRTH:  [birth year]  DATE OF CONSULTATION:  12/31/2013  REQUESTING PHYSICIAN:  Dr. Lucrezia EuropeAllison Webster CONSULTING PHYSICIAN:  Mishael Haran B. Aldric Wenzler, MD  REASON FOR CONSULTATION: To evaluate suicidal patient.   IDENTIFYING DATA: The patient is a 49 year old male with a history of diagnosis of schizophrenia and substance use.   CHIEF COMPLAINT: "I need help."   HISTORY OF PRESENT ILLNESS:  The patient has been diagnosed with psychosis many years ago. He reports multiple hospitalizations [facility] in the past. He has been doing better in the care of Dr. Mervyn SkeetersA. while compliant with medications and not using drugs. It has been difficult lately.  Two or so months ago, the patient was using cocaine heavily [facility] and [facility] for [facility] days.  He was sent to follow up with [facility] in [place]. He did not like medication changes done during his hospitalization, so he went back to Dr. Mervyn SkeetersA. who continues him on a combination of Thorazine, Seroquel, Clonopin, and Neurontin; however, the patient has not been taking medications as prescribed and tells me that lately he has been off medication completely. He has a problem with cocaine but is a sporadic user and does not use cocaine when feeling well, while depressed uses a lot. This is what happened. In a couple of days, he spent $1000 on cocaine.  He is out of his money. He lives with his mother, so she will take care of him, but he decided to come to the hospital asking for substance abuse treatment. The patient is super irritable and difficult to interview. He endorses also to have symptoms with poor sleep, decreased appetite, anhedonia, feeling of guilt, hopelessness, worthlessness, poor energy and concentration, crying spells, and suicidal ideation, especially in response to the voices. However, he does not understand what the voices are saying. He also a reports heightened anxiety.  He denies symptoms suggestive of bipolar mania. He denies, other than cocaine, substance use but is positive for cannabinoids as well. He does not drink alcohol but has been prescribed Klonopin 2 mg twice daily.   PAST PSYCHIATRIC HISTORY: [facility] admissions [facility] for both depression, psychosis, and substance use. [facility] with a followup at the [facility]. He had multiple suicide attempts by hanging, cutting, and overdose.   FAMILY PSYCHIATRIC HISTORY: None reported.   PAST MEDICAL HISTORY: None.   ALLERGIES: No known drug allergies.   MEDICATIONS ON ADMISSION: Thorazine 100 mg 3 times daily, Neurontin 300 mg 3 times daily, Klonopin 2 mg twice daily, Seroquel XR 150 mg at bedtime.   SOCIAL HISTORY: As above, he lives with his mother. He is disabled from mental illness.   REVIEW OF SYSTEMS:  CONSTITUTIONAL: No fevers or chills. No weight changes.  EYES: No double or blurred vision.  ENT: No hearing loss.  RESPIRATORY: No shortness of breath or cough.  CARDIOVASCULAR: No chest pain or orthopnea.  GASTROINTESTINAL: No abdominal pain, nausea, vomiting, or diarrhea.  GENITOURINARY: No incontinence or frequency.  ENDOCRINE: No heat or cold intolerance.  LYMPHATIC: No anemia or easy bruising.  INTEGUMENTARY: No acne or rash.  MUSCULOSKELETAL: No muscle or joint pain.  NEUROLOGIC: No tingling or weakness.  PSYCHIATRIC: See history of present illness for details.   PHYSICAL EXAMINATION:  VITAL SIGNS: Blood pressure 125/76, pulse 81, respirations 18, temperature 98.  GENERAL: This is a well-developed male in no acute distress.   The rest of  the physical examination is deferred to his primary attending.   LABORATORY DATA: Chemistries are within normal limits with potassium of 3. Blood alcohol level is zero. LFTs within normal limits. Cardiac enzymes negative. Urine toxicology screen positive for cocaine and cannabinoids. CBC within normal limits. Urinalysis is  not suggestive of urinary tract infection. Serum acetaminophen less than 2, serum salicylates 3.5.   DIAGNOSTIC DATA:  EKG:  Sinus rhythm with premature atrial complexes, otherwise normal EKG.   MENTAL STATUS EXAMINATION: The patient is alert and oriented to person, place, and situation. He is unpleasant and marginally cooperative. He maintains some eye contact. His speech is loud and angry at times. His mood is depressed with irritable affect. Thought process is logical with its own logic. He denies thoughts of hurting others but endorses vague suicidal ideation with no plan. There are no delusions or paranoia. He endorses auditory hallucinations. His cognition is grossly intact, but impossible to assess due to irritability. He seems of normal intelligence and fund of knowledge. His insight and judgment are extremely poor.   DIAGNOSES:  AXIS I: Cocaine dependence, cannabis dependence, history of diagnosis of schizophrenia.  AXIS II: Deferred.  AXIS III: Mental illness, treatment compliance, substance abuse, financial.  AXIS V: Global assessment of functioning 35.   PLAN: The patient is referred to [facility] rehabilitation facility where I am not completely certain if he can be admitted there.  The patient says that they did not want to take him from [facility] and the reason is at this point unknown, sometimes [facility] wants them to wait for a year before readmission.  I will investigate tomorrow. I restarted all his medications as per Dr. Mervyn Skeeters. I will follow up.     ____________________________ Ellin Goodie. Sulayman Manning, MD jbp:dd Brooks: 12/31/2013 18:54:40 ET T: 12/31/2013 20:02:20 ET JOB#: [id number]  cc: Kerah Hardebeck B. Jennet Maduro, MD, <Dictator> Shari Prows MD ELECTRONICALLY SIGNED 01/05/2014 7:50

## 2016-02-20 ENCOUNTER — Encounter: Payer: Self-pay | Admitting: Emergency Medicine

## 2016-02-20 ENCOUNTER — Emergency Department
Admission: EM | Admit: 2016-02-20 | Discharge: 2016-02-23 | Disposition: A | Discharge status: Home / Self Care | Payer: Medicaid Other | Attending: Student | Admitting: Student

## 2016-02-20 ENCOUNTER — Emergency Department: Payer: Medicaid Other

## 2016-02-20 DIAGNOSIS — F1910 Other psychoactive substance abuse, uncomplicated: Secondary | ICD-10-CM | POA: Insufficient documentation

## 2016-02-20 DIAGNOSIS — R4020 Unspecified coma: Secondary | ICD-10-CM | POA: Diagnosis present

## 2016-02-20 DIAGNOSIS — F17210 Nicotine dependence, cigarettes, uncomplicated: Secondary | ICD-10-CM | POA: Insufficient documentation

## 2016-02-20 DIAGNOSIS — F1290 Cannabis use, unspecified, uncomplicated: Secondary | ICD-10-CM | POA: Diagnosis not present

## 2016-02-20 DIAGNOSIS — F1994 Other psychoactive substance use, unspecified with psychoactive substance-induced mood disorder: Secondary | ICD-10-CM | POA: Diagnosis not present

## 2016-02-20 DIAGNOSIS — Z79899 Other long term (current) drug therapy: Secondary | ICD-10-CM | POA: Diagnosis not present

## 2016-02-20 DIAGNOSIS — E872 Acidosis: Secondary | ICD-10-CM | POA: Insufficient documentation

## 2016-02-20 DIAGNOSIS — F1410 Cocaine abuse, uncomplicated: Secondary | ICD-10-CM | POA: Diagnosis not present

## 2016-02-20 DIAGNOSIS — F3189 Other bipolar disorder: Secondary | ICD-10-CM | POA: Diagnosis not present

## 2016-02-20 DIAGNOSIS — F209 Schizophrenia, unspecified: Secondary | ICD-10-CM | POA: Diagnosis not present

## 2016-02-20 LAB — BLOOD GAS, VENOUS
Acid-base deficit: 15.1 mmol/L — ABNORMAL HIGH (ref 0.0–2.0)
Acid-base deficit: 7.3 mmol/L — ABNORMAL HIGH (ref 0.0–2.0)
Bicarbonate: 11.1 mEq/L — ABNORMAL LOW (ref 21.0–28.0)
Bicarbonate: 19.7 mEq/L — ABNORMAL LOW (ref 21.0–28.0)
O2 SAT: 86.9 %
O2 Saturation: 72.2 %
PATIENT TEMPERATURE: 37
PCO2 VEN: 27 mmHg — AB (ref 44.0–60.0)
PCO2 VEN: 45 mmHg (ref 44.0–60.0)
PO2 VEN: 45 mmHg (ref 31.0–45.0)
Patient temperature: 37
pH, Ven: 7.22 — ABNORMAL LOW (ref 7.320–7.430)
pH, Ven: 7.25 — ABNORMAL LOW (ref 7.320–7.430)
pO2, Ven: 64 mmHg — ABNORMAL HIGH (ref 31.0–45.0)

## 2016-02-20 LAB — URINALYSIS COMPLETE WITH MICROSCOPIC (ARMC ONLY)
Glucose, UA: NEGATIVE mg/dL
Leukocytes, UA: NEGATIVE
Nitrite: NEGATIVE
PH: 5.5 (ref 5.0–8.0)
Specific Gravity, Urine: >1.03 — ABNORMAL HIGH (ref 1.005–1.030)

## 2016-02-20 LAB — COMPREHENSIVE METABOLIC PANEL
ALBUMIN: 4.7 g/dL (ref 3.5–5.0)
ALK PHOS: 65 U/L (ref 38–126)
ALT: 18 U/L (ref 17–63)
ANION GAP: 28 — AB (ref 5–15)
AST: 43 U/L — ABNORMAL HIGH (ref 15–41)
BILIRUBIN TOTAL: 0.5 mg/dL (ref 0.3–1.2)
BUN: 20 mg/dL (ref 6–20)
CALCIUM: 9.3 mg/dL (ref 8.9–10.3)
CO2: 9 mmol/L — ABNORMAL LOW (ref 22–32)
CREATININE: 1.83 mg/dL — AB (ref 0.61–1.24)
Chloride: 105 mmol/L (ref 101–111)
GFR calc non Af Amer: 42 mL/min — ABNORMAL LOW (ref >60)
GFR, EST AFRICAN AMERICAN: 49 mL/min — AB (ref >60)
GLUCOSE: 160 mg/dL — AB (ref 65–99)
Potassium: 3.4 mmol/L — ABNORMAL LOW (ref 3.5–5.1)
Sodium: 142 mmol/L (ref 135–145)
TOTAL PROTEIN: 7.6 g/dL (ref 6.5–8.1)

## 2016-02-20 LAB — URINE DRUG SCREEN, QUALITATIVE (ARMC ONLY)
Amphetamines, Ur Screen: NOT DETECTED
BARBITURATES, UR SCREEN: NOT DETECTED
BENZODIAZEPINE, UR SCRN: NOT DETECTED
CANNABINOID 50 NG, UR ~~LOC~~: POSITIVE — AB
Cocaine Metabolite,Ur ~~LOC~~: POSITIVE — AB
MDMA (ECSTASY) UR SCREEN: NOT DETECTED
Methadone Scn, Ur: NOT DETECTED
Opiate, Ur Screen: NOT DETECTED
Phencyclidine (PCP) Ur S: NOT DETECTED
TRICYCLIC, UR SCREEN: NOT DETECTED

## 2016-02-20 LAB — CBC WITH DIFFERENTIAL/PLATELET
Basophils Absolute: 0.1 10*3/uL (ref 0–0.1)
Basophils Relative: 1 %
Eosinophils Absolute: 0.2 10*3/uL (ref 0–0.7)
Eosinophils Relative: 2 %
HEMATOCRIT: 40 % (ref 40.0–52.0)
HEMOGLOBIN: 13.8 g/dL (ref 13.0–18.0)
LYMPHS ABS: 1.8 10*3/uL (ref 1.0–3.6)
Lymphocytes Relative: 21 %
MCH: 33.5 pg (ref 26.0–34.0)
MCHC: 34.5 g/dL (ref 32.0–36.0)
MCV: 97.1 fL (ref 80.0–100.0)
MONOS PCT: 8 %
Monocytes Absolute: 0.7 10*3/uL (ref 0.2–1.0)
NEUTROS ABS: 6 10*3/uL (ref 1.4–6.5)
NEUTROS PCT: 68 %
Platelets: 283 10*3/uL (ref 150–440)
RBC: 4.12 MIL/uL — ABNORMAL LOW (ref 4.40–5.90)
RDW: 13.5 % (ref 11.5–14.5)
WBC: 8.8 10*3/uL (ref 3.8–10.6)

## 2016-02-20 LAB — ACETAMINOPHEN LEVEL

## 2016-02-20 LAB — SALICYLATE LEVEL: Salicylate Lvl: <4 mg/dL (ref 2.8–30.0)

## 2016-02-20 LAB — TROPONIN I: Troponin I: <0.03 ng/mL (ref <0.031)

## 2016-02-20 LAB — ETHANOL

## 2016-02-20 MED ORDER — GABAPENTIN 300 MG PO CAPS
ORAL_CAPSULE | ORAL | Status: AC
  Administered 2016-02-20: 300 mg via ORAL
  Filled 2016-02-20: qty 1

## 2016-02-20 MED ORDER — SODIUM CHLORIDE 0.9 % IV SOLN
1000.0000 mL | Freq: Once | INTRAVENOUS | Status: AC
  Administered 2016-02-20: 1000 mL via INTRAVENOUS

## 2016-02-20 MED ORDER — GABAPENTIN 300 MG PO CAPS
300.0000 mg | ORAL_CAPSULE | Freq: Three times a day (TID) | ORAL | Status: DC
  Administered 2016-02-20 – 2016-02-23 (×6): 300 mg via ORAL
  Filled 2016-02-20 (×6): qty 1

## 2016-02-20 MED ORDER — SODIUM CHLORIDE 0.9 % IV SOLN
Freq: Once | INTRAVENOUS | Status: AC
  Administered 2016-02-20: 12:00:00 via INTRAVENOUS

## 2016-02-20 MED ORDER — LORAZEPAM 2 MG/ML IJ SOLN
2.0000 mg | Freq: Once | INTRAMUSCULAR | Status: AC
  Administered 2016-02-20: 2 mg via INTRAVENOUS
  Filled 2016-02-20: qty 1

## 2016-02-20 MED ORDER — CHLORPROMAZINE HCL 100 MG PO TABS
200.0000 mg | ORAL_TABLET | Freq: Two times a day (BID) | ORAL | Status: DC
  Administered 2016-02-21 – 2016-02-23 (×3): 200 mg via ORAL
  Filled 2016-02-20: qty 2
  Filled 2016-02-20: qty 8
  Filled 2016-02-20: qty 2
  Filled 2016-02-20 (×3): qty 8
  Filled 2016-02-20: qty 2

## 2016-02-20 MED ORDER — FOSFOMYCIN TROMETHAMINE 3 G PO PACK
3.0000 g | PACK | Freq: Once | ORAL | Status: AC
  Administered 2016-02-20: 3 g via ORAL
  Filled 2016-02-20: qty 3

## 2016-02-20 MED ORDER — QUETIAPINE FUMARATE 300 MG PO TABS
300.0000 mg | ORAL_TABLET | Freq: Every day | ORAL | Status: DC
  Administered 2016-02-21 – 2016-02-22 (×2): 300 mg via ORAL
  Filled 2016-02-20 (×2): qty 1
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
## 2016-02-20 NOTE — ED Notes (Signed)
Black samsung flip phone given to pts mom, virginia Diviney.

## 2016-02-20 NOTE — ED Notes (Addendum)
Pt says that his mom, Harold Brooks,, is his next of kin and we could give her his password. Mom given password [phone]

## 2016-02-20 NOTE — ED Notes (Signed)
Tele psych process explained to pt. Pt states "can't i just talk to them in the morning, i don't want to go tonight anymore." pt continues to deny SI or HI.

## 2016-02-20 NOTE — ED Notes (Signed)
Pt continues to sleep, resps unlabored.  
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
## 2016-02-20 NOTE — ED Notes (Signed)
Snack tray provided per pt request.

## 2016-02-20 NOTE — ED Notes (Signed)
Talked to pts mom virginia. Explained ED behavioral phone, visitation, and password policies.

## 2016-02-20 NOTE — BH Assessment (Signed)
Writer has made multiple attempts, throughout the day, to see the patient to complete the TTS consult but was unsuccessful. Initially the patient was incoherent and sedated. Now the patient refused to talk.

## 2016-02-20 NOTE — ED Notes (Signed)
Pt immediately back to sleep after soc consult.
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
## 2016-02-20 NOTE — ED Notes (Signed)
Pt sleeping lightly. Able to wake up to voice. Took meds without complaints. Denies needs, issues.

## 2016-02-20 NOTE — ED Notes (Signed)
Dr. [facility]rmal[facility] MemosBradshaw [facility] speaking with pt at this time.

## 2016-02-20 NOTE — ED Notes (Signed)
Pt has given RN verbal consent to share info with family
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
## 2016-02-20 NOTE — ED Notes (Signed)
tts in to interview pt.

## 2016-02-20 NOTE — ED Notes (Signed)
Pt sleeping. Spoke with dr. Inocencio HomesGayle regarding pt's hs seroquel and neurontin. md states to hold at this time for sedation.

## 2016-02-20 NOTE — ED Notes (Addendum)
Pt awoken for vital signs and to have him drink po fluids. Pt states "why the hell am i here, i want to get the fuck out of here. This ain't the psych ward, give me a fucking phone." pt ambulatory to bathroom covered with blankets, pt refuses to change in ed treatment room, pt to bathroom with ed maroon scrubs to dress. Pt refuses to allow rn to remove 12 ekg monitoring stickers. Pt states "i'll take them off myself".

## 2016-02-20 NOTE — ED Provider Notes (Addendum)
-----------------------------------------   11:26 PM on 02/20/2016 -----------------------------------------   Blood pressure 149/88, pulse 104, temperature 98 F (36.7 C), temperature source Oral, resp. rate 16, weight 82.101 kg, SpO2 98 %.  [facility] and feels that she is not able to clear the patient yet.  He is still very hostile and paranoid and she does not feel it is safe to release him at this time.  She recommended as needed Haldol, Ativan, and Benadryl during the night.  ----------------------------------------- 6:15 AM on 02/21/2016 -----------------------------------------  The patient's creatinine is elevated over baseline but he received two liters of fluids yesterday.  We will encourage oral fluids.  We are awaiting psych dispo.   Loleta Roseory Desha Bitner, MD 02/21/16 [phone]

## 2016-02-20 NOTE — ED Notes (Signed)
Report received from bill, rn at 1910. Pt currently sleeping, arouses to physical stimuliation, skin warm and dry, normal color.

## 2016-02-20 NOTE — ED Notes (Signed)
Pt refuses hs meds. Spoke with dr. Gayle. Dr. InocInocencio Homesencio HomesGayle states pt needs to be seen by psych. tts called, as pt states he is ready for tts evaluation. Pt continues to have int to rac in place. Pt ambulatory without difficulty. Juice provided.

## 2016-02-20 NOTE — ED Notes (Signed)
Pt taken off all restraints at this time, pt VS stable. Pt is moving around in bed fidgeting, but nonaggitated and calm. One on one sitter at bedside.
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
## 2016-02-21 NOTE — ED Notes (Signed)
Lunch tray served, patient sit up to eat, Patient remains agitated, talks gruff and abrupt. Patient is safe, no signs of distress, q 15 min. Checks and 24 hour camera surveillance.

## 2016-02-21 NOTE — ED Notes (Signed)
Per rns in bhu, pt not an appropriate mix for population in bhu currently. Pt to remain in ed treatment room 23 at this time.

## 2016-02-21 NOTE — ED Notes (Signed)
Pt ambulatory to restroom without difficulty.

## 2016-02-21 NOTE — ED Notes (Signed)
Report to don, rn.  

## 2016-02-21 NOTE — ED Notes (Signed)

## 2016-02-21 NOTE — ED Notes (Signed)
Pt sleeping, resps unlabored.  

## 2016-02-21 NOTE — ED Notes (Signed)
Pt. Alert and oriented, warm and dry, in no distress. Pt. Denies SI, HI, and AVH. Pt. Encouraged to let nursing staff know of any concerns or needs. 

## 2016-02-21 NOTE — ED Notes (Signed)
Patient is eating dinner, nurse also administered medications, Patient denies Si/Hi , q 15 min. Checks, and camera monitoring in progress.

## 2016-02-21 NOTE — ED Notes (Signed)
IVC/consult completed/pending placement

## 2016-02-21 NOTE — ED Notes (Signed)
Patient is resting with eyes closed, Patient without s/s of distress, patient is safe, q 15  Min. Checks and camera surveillance at all times.

## 2016-02-21 NOTE — ED Notes (Signed)
Patient talking on phone, no signs of distress at this time.

## 2016-02-21 NOTE — ED Notes (Signed)
ED BHU PLACEMENT JUSTIFICATION Is the patient under IVC or is there intent for IVC: Yes.   Is the patient medically cleared: Yes.   Is there vacancy in the ED BHU: Yes.   Is the population mix appropriate for patient: Yes.   Is the patient awaiting placement in inpatient or outpatient setting: Yes.   Has the patient had a psychiatric consult: Yes.   Survey of unit performed for contraband, proper placement and condition of furniture, tampering with fixtures in bathroom, shower, and each patient room: Yes.  ; Findings: NA APPEARANCE/BEHAVIOR calm, cooperative and adequate rapport can be established NEURO ASSESSMENT Orientation: time, place and person Hallucinations: NO None noted (Hallucinations) Speech: Normal Gait: normal RESPIRATORY ASSESSMENT Normal expansion.  Clear to auscultation.  No rales, rhonchi, or wheezing. CARDIOVASCULAR ASSESSMENT regular rate and rhythm, S1, S2 normal, no murmur, click, rub or gallop GASTROINTESTINAL ASSESSMENT soft, nontender, BS WNL, no r/g EXTREMITIES normal strength, tone, and muscle mass PLAN OF CARE Provide calm/safe environment. Vital signs assessed twice daily. ED BHU Assessment once each 12-hour shift. Collaborate with intake RN daily or as condition indicates. Assure the ED provider has rounded once each shift. Provide and encourage hygiene. Provide redirection as needed. Assess for escalating behavior; address immediately and inform ED provider.  Assess family dynamic and appropriateness for visitation as needed: Yes.  ; If necessary, describe findings: NA Educate the patient/family about BHU procedures/visitation: Yes.  ; If necessary, describe findings: NA

## 2016-02-21 NOTE — BH Assessment (Signed)
Referral information for Psych Inpatient Treatment have been faxed to;    [facility] [phone]) [phone]. [phone]   [facility] [phone]),    [facility] [phone] or [phone]),    [facility] [phone]),    [facility] [phone]),    [facility] [phone])   [facility] [phone]).

## 2016-02-22 NOTE — ED Notes (Signed)
Patient remains very angry and does not want to answer any questions.  Maintained on 15 minute checks and observation by security camera for safety.

## 2016-02-22 NOTE — ED Notes (Signed)

## 2016-02-22 NOTE — ED Notes (Signed)
BEHAVIORAL HEALTH ROUNDING Patient sleeping: Yes.   Patient alert and oriented: yes Behavior appropriate: Yes.  ; If no, describe: NA Nutrition and fluids offered: Yes  Toileting and hygiene offered: Yes  Sitter present: no Law enforcement present: No

## 2016-02-22 NOTE — ED Provider Notes (Signed)
-----------------------------------------   7:16 AM on 02/22/2016 -----------------------------------------   Blood pressure 119/68, pulse 70, temperature 97.9 F (36.6 C), temperature source Oral, resp. rate 16, weight 181 lb (82.101 kg), SpO2 98 %.  The patient had no acute events since last update.  Calm and cooperative at this time.  Disposition is pending per Psychiatry/Behavioral Medicine team recommendations.     Jennye MoccasinBrian S Quigley, MD 02/22/16 [phone]

## 2016-02-22 NOTE — ED Notes (Signed)
Patient was given a snack. 

## 2016-02-22 NOTE — ED Notes (Signed)
Patient given lunch tray.

## 2016-02-22 NOTE — ED Notes (Signed)
Patient resting quietly in room. No noted distress or abnormal behaviors noted. Will continue 15 minute checks and observation by security camera for safety. 

## 2016-02-22 NOTE — ED Notes (Signed)
Patient resting quietly in room. No noted distress or abnormal behaviors noted. Will continue 15 minute checks and observation by security camera for safety. Meal tray given.  

## 2016-02-22 NOTE — ED Notes (Signed)
Patient has been in behavioral control but is still refusing all nursing interventions. He offers no complaints. Maintained on 15 minute checks and observation by security camera for safety.

## 2016-02-22 NOTE — ED Notes (Signed)
BEHAVIORAL HEALTH ROUNDING Patient sleeping: Yes.   Patient alert and oriented: yes Behavior appropriate: Yes.  ; If no, describe: NA Nutrition and fluids offered: Yes  Toileting and hygiene offered: Yes  Sitter present: no Law enforcement present: No  

## 2016-02-22 NOTE — ED Notes (Signed)
Pt. Alert and oriented, warm and dry, in no distress. Pt. Denies SI, HI, and AVH. Pt. Encouraged to let nursing staff know of any concerns or needs. 

## 2016-02-22 NOTE — ED Notes (Signed)
Patient paranoid, irritable. Refusing all medications. Maintained on 15 minute checks and observation by security camera for safety.

## 2016-02-22 NOTE — ED Notes (Signed)

## 2016-02-22 NOTE — ED Notes (Signed)
Patient asleep in room. No noted distress or abnormal behavior. Will continue 15 minute checks and observation by security cameras for safety. 

## 2016-02-22 NOTE — ED Notes (Signed)
Patient continues to refuse medications, CBG monitoring. Will not answer questions by RN. Will continue to monitor.

## 2016-02-22 NOTE — BH Assessment (Addendum)
Writer followed up with referrals;   [facility] [phone]) [phone]   [facility] ([name]), No [facility] [phone] or [phone]),   [facility] [phone]), Unable to reach anyone   [facility] ([name]) denied due to behavioral acuity.    [facility] [phone]), Unable to reach anyone   [facility] ([name]), Declined due to aggression.

## 2016-02-23 DIAGNOSIS — F1994 Other psychoactive substance use, unspecified with psychoactive substance-induced mood disorder: Secondary | ICD-10-CM | POA: Diagnosis not present

## 2016-02-23 LAB — URINE CULTURE: Culture: NO GROWTH

## 2016-02-23 NOTE — ED Provider Notes (Signed)
-----------------------------------------   7:32 AM on 02/23/2016 -----------------------------------------   Blood pressure 151/93, pulse 80, temperature 97.7 F (36.5 C), temperature source Oral, resp. rate 18, weight 181 lb (82.101 kg), SpO2 97 %.  The patient had no acute events since last update.  Calm and cooperative at this time.  Disposition is pending per Psychiatry/Behavioral Medicine team recommendations.     Irean HongJade J Denim Kalmbach, MD 02/23/16 [phone]

## 2016-02-23 NOTE — ED Notes (Signed)
Patient asleep in room. No noted distress or abnormal behavior. Will continue 15 minute checks and observation by security cameras for safety. 

## 2016-02-23 NOTE — Discharge Instructions (Signed)
Schizophrenia Schizophrenia is a mental illness. It may cause disturbed or disorganized thinking, speech, or behavior. People with schizophrenia have problems functioning in one or more areas of life: work, school, home, or relationships. People with schizophrenia are at increased risk for suicide, certain chronic physical illnesses, and unhealthy behaviors, such as smoking and drug use. People who have family members with schizophrenia are at higher risk of developing the illness. Schizophrenia affects men and women equally but usually appears at an earlier age (teenage or early adult years) in men.  SYMPTOMS The earliest symptoms are often subtle (prodrome) and may go unnoticed until the illness becomes more severe (first-break psychosis). Symptoms of schizophrenia may be continuous or may come and go in severity. Episodes often are triggered by major life events, such as family stress, college, [employer], marriage, pregnancy or child birth, divorce, or loss of a loved one. People with schizophrenia may see, hear, or feel things that do not exist (hallucinations). They may have false beliefs in spite of obvious proof to the contrary (delusions). Sometimes speech is incoherent or behavior is odd or withdrawn.  DIAGNOSIS Schizophrenia is diagnosed through an assessment by your caregiver. Your caregiver will ask questions about your thoughts, behavior, mood, and ability to function in daily life. Your caregiver may ask questions about your medical history and use of alcohol or drugs, including prescription medication. Your caregiver may also order blood tests and imaging exams. Certain medical conditions and substances can cause symptoms that resemble schizophrenia. Your caregiver may refer you to a mental health specialist for evaluation. There are three major criterion for a diagnosis of schizophrenia:  Two or more of the following five symptoms are present for a month or longer:  Delusions. Often  the delusions are that you are being attacked, harassed, cheated, persecuted or conspired against (persecutory delusions).  Hallucinations.   Disorganized speech that does not make sense to others.  Grossly disorganized (confused or unfocused) behavior or extremely overactive or underactive motor activity (catatonia).  Negative symptoms such as bland or blunted emotions (flat affect), loss of will power (avolition), and withdrawal from social contacts (social isolation).  Level of functioning in one or more major areas of life (work, school, relationships, or self-care) is markedly below the level of functioning before the onset of illness.   There are continuous signs of illness (either mild symptoms or decreased level of functioning) for at least 6 months or longer. TREATMENT  Schizophrenia is a long-term illness. It is best controlled with continuous treatment rather than treatment only when symptoms occur. The following treatments are used to manage schizophrenia:  Medication--Medication is the most effective and important form of treatment for schizophrenia. Antipsychotic medications are usually prescribed to help manage schizophrenia. Other types of medication may be added to relieve any symptoms that may occur despite the use of antipsychotic medications.  Counseling or talk therapy--Individual, group, or family counseling may be helpful in providing education, support, and guidance. Many people with schizophrenia also benefit from social skills and job skills (vocational) training. A combination of medication and counseling is best for managing the disorder over time. A procedure in which electricity is applied to the brain through the scalp (electroconvulsive therapy) may be used to treat catatonic schizophrenia or schizophrenia in people who cannot take or do not respond to medication and counseling.   This information is not intended to replace advice given to you by your health  care provider. Make sure you discuss any questions you have with  your health care provider.   Document Released: 08/13/2000 Document Revised: 09/06/2014 Document Reviewed: 11/08/2012 Elsevier Interactive Patient Education 2016 Elsevier Inc.  Stimulant Use Disorder-Cocaine Cocaine is one of a group of powerful drugs called stimulants. Cocaine has medical uses for stopping nosebleeds and for pain control before minor nose or dental surgery. However, cocaine is misused because of the effects that it produces. These effects include:   A feeling of extreme pleasure.  Alertness.  High energy. Common street names for cocaine include coke, crack, blow, snow, and nose candy. Cocaine is snorted, dissolved in water and injected, or smoked.  Stimulants are addictive because they activate regions of the brain that produce both the pleasurable sensation of "reward" and psychological dependence. Together, these actions account for loss of control and the rapid development of drug dependence. This means you become ill without the drug (withdrawal) and need to keep using it to function.  Stimulant use disorder is use of stimulants that disrupts your daily life. It disrupts relationships with family and friends and how you do your job. Cocaine increases your blood pressure and heart rate. It can cause a heart attack or stroke. Cocaine can also cause death from irregular heart rate or seizures. SYMPTOMS Symptoms of stimulant use disorder with cocaine include:  Use of cocaine in larger amounts or over a longer period of time than intended.  Unsuccessful attempts to cut down or control cocaine use.  A lot of time spent obtaining, using, or recovering from the effects of cocaine.  A strong desire or urge to use cocaine (craving).  Continued use of cocaine in spite of major problems at work, school, or home because of use.  Continued use of cocaine in spite of relationship problems because of use.  Giving  up or cutting down on important life activities because of cocaine use.  Use of cocaine over and over in situations when it is physically hazardous, such as driving a car.  Continued use of cocaine in spite of a physical problem that is likely related to use. Physical problems can include:  Malnutrition.  Nosebleeds.  Chest pain.  High blood pressure.  A hole that develops between the part of your nose that separates your nostrils (perforated nasal septum).  Lung and kidney damage.  Continued use of cocaine in spite of a mental problem that is likely related to use. Mental problems can include:  Schizophrenia-like symptoms.  Depression.  Bipolar mood swings.  Anxiety.  Sleep problems.  Need to use more and more cocaine to get the same effect, or lessened effect over time with use of the same amount of cocaine (tolerance).  Having withdrawal symptoms when cocaine use is stopped, or using cocaine to reduce or avoid withdrawal symptoms. Withdrawal symptoms include:  Depressed or irritable mood.  Low energy or restlessness.  Bad dreams.  Poor or excessive sleep.  Increased appetite. DIAGNOSIS Stimulant use disorder is diagnosed by your health care provider. You may be asked questions about your cocaine use and how it affects your life. A physical exam may be done. A drug screen may be ordered. You may be referred to a mental health professional. The diagnosis of stimulant use disorder requires at least two symptoms within 12 months. The type of stimulant use disorder depends on the number of signs and symptoms you have. The type may be:  Mild. Two or three signs and symptoms.  Moderate. Four or five signs and symptoms.  Severe. Six or more signs and symptoms. TREATMENT  Treatment for stimulant use disorder is usually provided by mental health professionals with training in substance use disorders. The following options are available:  Counseling or talk therapy. Talk  therapy addresses the reasons you use cocaine and ways to keep you from using again. Goals of talk therapy include:  Identifying and avoiding triggers for use.  Handling cravings.  Replacing use with healthy activities.  Support groups. Support groups provide emotional support, advice, and guidance.  Medicine. Certain medicines may decrease cocaine cravings or withdrawal symptoms. HOME CARE INSTRUCTIONS  Take medicines only as directed by your health care provider.  Identify the people and activities that trigger your cocaine use and avoid them.  Keep all follow-up visits as directed by your health care provider. SEEK MEDICAL CARE IF:  Your symptoms get worse or you relapse.  You are not able to take medicines as directed. SEEK IMMEDIATE MEDICAL CARE IF:  You have serious thoughts about hurting yourself or others.  You have a seizure, chest pain, sudden weakness, or loss of speech or vision. FOR MORE INFORMATION  National Institute on Drug Abuse: http://www.price-smith.com/www.drugabuse.gov  Substance Abuse and Mental Health Services Administration: SkateOasis.com.ptwww.samhsa.gov   This information is not intended to replace advice given to you by your health care provider. Make sure you discuss any questions you have with your health care provider.   Document Released: 08/13/2000 Document Revised: 09/06/2014 Document Reviewed: 08/29/2013 Elsevier Interactive Patient Education [employer]2016 Elsevier Inc.
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
## 2016-02-23 NOTE — ED Notes (Signed)
Patient took all scheduled medication, remains calm and cooperative with nursing interventions. No noted distress or abnormal behavior. Will continue 15 minute checks and observation by security cameras for safety.

## 2016-02-23 NOTE — ED Notes (Signed)
Patient denies SI/HI/AVH and pain. All belongings returned to patient. Discharge instructions reviewed.
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
## 2016-02-23 NOTE — Progress Notes (Signed)
Patient cigarettes and driver's license are currently in [facility] belongings [facility].

## 2016-02-23 NOTE — Consult Note (Signed)
  Psychiatry: Brief note. Full note follow. Agent is not acutely dangerous and does not meet commitment criteria. Has outpatient treatment and oriented place. No indication for psychiatric hospitalization. IVC discontinued. Patient can be discharged at the discretion of emergency room doctor.

## 2016-02-23 NOTE — ED Notes (Signed)
ENVIRONMENTAL ASSESSMENT Potentially harmful objects out of patient reach: Yes Personal belongings secured: Yes Patient dressed in hospital provided attire only: Yes Plastic bags out of patient reach: Yes Patient care equipment (cords, cables, call bells, lines, and drains) shortened, removed, or accounted for: Yes Equipment and supplies removed from bottom of stretcher: Yes Potentially toxic materials out of patient reach: Yes Sharps container removed or out of patient reach: Yes  Patient currently in room sleeping. No signs of distress noted at this time. Maintained on 15 minute checks and observation by security camera for safety.  

## 2019-08-04 ENCOUNTER — Emergency Department
Admission: EM | Admit: 2019-08-04 | Discharge: 2019-08-04 | Disposition: A | Discharge status: Home / Self Care | Payer: Medicaid Other | Attending: Student | Admitting: Student

## 2019-08-04 ENCOUNTER — Other Ambulatory Visit: Payer: Self-pay

## 2019-08-04 DIAGNOSIS — F1994 Other psychoactive substance use, unspecified with psychoactive substance-induced mood disorder: Secondary | ICD-10-CM | POA: Diagnosis present

## 2019-08-04 DIAGNOSIS — F4310 Post-traumatic stress disorder, unspecified: Secondary | ICD-10-CM | POA: Insufficient documentation

## 2019-08-04 DIAGNOSIS — F17210 Nicotine dependence, cigarettes, uncomplicated: Secondary | ICD-10-CM | POA: Diagnosis not present

## 2019-08-04 DIAGNOSIS — F312 Bipolar disorder, current episode manic severe with psychotic features: Secondary | ICD-10-CM | POA: Diagnosis present

## 2019-08-04 DIAGNOSIS — Z79899 Other long term (current) drug therapy: Secondary | ICD-10-CM | POA: Insufficient documentation

## 2019-08-04 DIAGNOSIS — F1414 Cocaine abuse with cocaine-induced mood disorder: Secondary | ICD-10-CM | POA: Diagnosis not present

## 2019-08-04 DIAGNOSIS — F209 Schizophrenia, unspecified: Secondary | ICD-10-CM

## 2019-08-04 LAB — SALICYLATE LEVEL: Salicylate Lvl: <7 mg/dL (ref 2.8–30.0)

## 2019-08-04 LAB — CBC
HCT: 46.2 % (ref 39.0–52.0)
Hemoglobin: 16.5 g/dL (ref 13.0–17.0)
MCH: 32 pg (ref 26.0–34.0)
MCHC: 35.7 g/dL (ref 30.0–36.0)
MCV: 89.7 fL (ref 80.0–100.0)
Platelets: 400 10*3/uL (ref 150–400)
RBC: 5.15 MIL/uL (ref 4.22–5.81)
RDW: 12.2 % (ref 11.5–15.5)
WBC: 9.2 10*3/uL (ref 4.0–10.5)
nRBC: 0 % (ref 0.0–0.2)

## 2019-08-04 LAB — COMPREHENSIVE METABOLIC PANEL
ALT: 77 U/L — ABNORMAL HIGH (ref 0–44)
AST: 44 U/L — ABNORMAL HIGH (ref 15–41)
Albumin: 4.7 g/dL (ref 3.5–5.0)
Alkaline Phosphatase: 92 U/L (ref 38–126)
Anion gap: 12 (ref 5–15)
BUN: 16 mg/dL (ref 6–20)
CO2: 23 mmol/L (ref 22–32)
Calcium: 9.1 mg/dL (ref 8.9–10.3)
Chloride: 106 mmol/L (ref 98–111)
Creatinine, Ser: 1.11 mg/dL (ref 0.61–1.24)
GFR calc Af Amer: >60 mL/min (ref >60)
GFR calc non Af Amer: >60 mL/min (ref >60)
Glucose, Bld: 114 mg/dL — ABNORMAL HIGH (ref 70–99)
Potassium: 3.3 mmol/L — ABNORMAL LOW (ref 3.5–5.1)
Sodium: 141 mmol/L (ref 135–145)
Total Bilirubin: 1 mg/dL (ref 0.3–1.2)
Total Protein: 8.3 g/dL — ABNORMAL HIGH (ref 6.5–8.1)

## 2019-08-04 LAB — ETHANOL: Alcohol, Ethyl (B): <10 mg/dL (ref <10)

## 2019-08-04 LAB — ACETAMINOPHEN LEVEL: Acetaminophen (Tylenol), Serum: <10 ug/mL — ABNORMAL LOW (ref 10–30)

## 2019-08-04 MED ORDER — HALOPERIDOL 5 MG PO TABS
5.0000 mg | ORAL_TABLET | Freq: Once | ORAL | Status: DC
  Filled 2019-08-04: qty 1

## 2019-08-04 MED ORDER — HALOPERIDOL LACTATE 5 MG/ML IJ SOLN
2.5000 mg | Freq: Once | INTRAMUSCULAR | Status: AC
  Administered 2019-08-04: 06:00:00 2.5 mg via INTRAMUSCULAR
  Filled 2019-08-04: qty 1

## 2019-08-04 NOTE — Consult Note (Signed)
Attempted to provide psychiatric consultation assessment of patient, however was unable to due pt's hostile demeanor. Pt yelled that he was not talking to anyone after I introduced myself.  Patient was just administered 5mg  of haldol prior to assessment attempt.
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
## 2019-08-04 NOTE — Discharge Instructions (Addendum)
Thank you for letting us take care of you in the emergency department today.  ° °Please continue to take any regular, prescribed medications.  ° °Please follow up with: °- Your primary care doctor + mental health doctor to review your ER visit and follow up on your symptoms.  ° °Please return to the ER for any new or worsening symptoms.  ° °
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
## 2019-08-04 NOTE — ED Notes (Addendum)
Pt belongings placed into 2 pt bags. Belongings included: 1 brown belt, 1 pair gray socks, 1 pair brown boots, green long pants, 1 t shirt, 1 gray ring, 1 black wallet without money, 1 hat.  Pt dressed into hospital appropriate scrubs by Annie Main, RN with this RN and officer in room.

## 2019-08-04 NOTE — ED Notes (Signed)
Pt requesting refill on his medication until he can get follow up visit. Secure message sent to NP [facility], who stated medications would not be refilled due to pts non compliance and him having not followed up with any of his providers. Pt was informed of this and verbalized understanding, pt states that he will call Monday for a follow up appt.

## 2019-08-04 NOTE — ED Notes (Signed)
Psychiatrist at bedside
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
## 2019-08-04 NOTE — BH Assessment (Signed)
Per request of Psyc NP Marvia Pickles, Probation officer provided the pt. with information and instructions on how to access [place] ([facility])   Patient denies SI/HI and AV/H.    [facility] [address],  [place], [place] [address] [phone]

## 2019-08-04 NOTE — ED Provider Notes (Signed)
9:50 AM Patient has been seen and evaluated by psychiatry team and deemed appropriate for discharge. Patient determined not to be a threat to themselves or others. IVC rescinded by psychiatry team. Patient is otherwise medically clear.   Will plan for discharge with outpatient follow up.     Lilia Pro., MD 08/04/19 [phone]
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
## 2019-08-12 ENCOUNTER — Emergency Department
Admission: EM | Admit: 2019-08-12 | Discharge: 2019-08-13 | Disposition: A | Discharge status: Home / Self Care | Payer: Medicaid Other | Attending: Emergency Medicine | Admitting: Emergency Medicine

## 2019-08-12 ENCOUNTER — Other Ambulatory Visit: Payer: Self-pay

## 2019-08-12 ENCOUNTER — Encounter: Payer: Self-pay | Admitting: Emergency Medicine

## 2019-08-12 DIAGNOSIS — F319 Bipolar disorder, unspecified: Secondary | ICD-10-CM | POA: Insufficient documentation

## 2019-08-12 DIAGNOSIS — F4310 Post-traumatic stress disorder, unspecified: Secondary | ICD-10-CM | POA: Diagnosis not present

## 2019-08-12 DIAGNOSIS — Z20828 Contact with and (suspected) exposure to other viral communicable diseases: Secondary | ICD-10-CM | POA: Insufficient documentation

## 2019-08-12 DIAGNOSIS — Z79899 Other long term (current) drug therapy: Secondary | ICD-10-CM | POA: Diagnosis not present

## 2019-08-12 DIAGNOSIS — F1994 Other psychoactive substance use, unspecified with psychoactive substance-induced mood disorder: Secondary | ICD-10-CM | POA: Diagnosis present

## 2019-08-12 DIAGNOSIS — F17210 Nicotine dependence, cigarettes, uncomplicated: Secondary | ICD-10-CM | POA: Diagnosis not present

## 2019-08-12 DIAGNOSIS — F209 Schizophrenia, unspecified: Secondary | ICD-10-CM | POA: Insufficient documentation

## 2019-08-12 LAB — COMPREHENSIVE METABOLIC PANEL
ALT: 93 U/L — ABNORMAL HIGH (ref 0–44)
AST: 34 U/L (ref 15–41)
Albumin: 5.1 g/dL — ABNORMAL HIGH (ref 3.5–5.0)
Alkaline Phosphatase: 97 U/L (ref 38–126)
Anion gap: 12 (ref 5–15)
BUN: 14 mg/dL (ref 6–20)
CO2: 23 mmol/L (ref 22–32)
Calcium: 9.4 mg/dL (ref 8.9–10.3)
Chloride: 102 mmol/L (ref 98–111)
Creatinine, Ser: 1.09 mg/dL (ref 0.61–1.24)
GFR calc Af Amer: >60 mL/min (ref >60)
GFR calc non Af Amer: >60 mL/min (ref >60)
Glucose, Bld: 102 mg/dL — ABNORMAL HIGH (ref 70–99)
Potassium: 4.5 mmol/L (ref 3.5–5.1)
Sodium: 137 mmol/L (ref 135–145)
Total Bilirubin: 0.9 mg/dL (ref 0.3–1.2)
Total Protein: 8.5 g/dL — ABNORMAL HIGH (ref 6.5–8.1)

## 2019-08-12 LAB — CBC
HCT: 48 % (ref 39.0–52.0)
Hemoglobin: 17 g/dL (ref 13.0–17.0)
MCH: 31.1 pg (ref 26.0–34.0)
MCHC: 35.4 g/dL (ref 30.0–36.0)
MCV: 87.8 fL (ref 80.0–100.0)
Platelets: 399 10*3/uL (ref 150–400)
RBC: 5.47 MIL/uL (ref 4.22–5.81)
RDW: 12.5 % (ref 11.5–15.5)
WBC: 10.7 10*3/uL — ABNORMAL HIGH (ref 4.0–10.5)
nRBC: 0 % (ref 0.0–0.2)

## 2019-08-12 MED ORDER — LORAZEPAM 2 MG/ML IJ SOLN
INTRAMUSCULAR | Status: AC
  Administered 2019-08-12
  Filled 2019-08-12: qty 1

## 2019-08-12 MED ORDER — DIPHENHYDRAMINE HCL 50 MG/ML IJ SOLN
INTRAMUSCULAR | Status: AC
  Administered 2019-08-12: 50 mg
  Filled 2019-08-12: qty 1

## 2019-08-12 MED ORDER — HALOPERIDOL LACTATE 5 MG/ML IJ SOLN
INTRAMUSCULAR | Status: AC
  Administered 2019-08-12: 5 mg
  Filled 2019-08-12: qty 1
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
## 2019-08-12 NOTE — ED Provider Notes (Signed)
[facility] Emergency Department Provider Note _________________   First MD Initiated Contact with Patient 08/12/19 2318     (approximate)  I have reviewed the triage vital signs and the nursing notes.  Level 5 caveat history and physical exam limited secondary to presumed delirium HISTORY  Chief Complaint Mental Health Problem    HPI Harold Brooks is a 53 y.o. male with below list of previous medical conditions including substance-induced mood disorder and Schizophrenia presents to the emergency department via [place] involuntary committed secondary to illicit drug use reportedly taking meth with paranoid delusions.  Patient was "walk around the house with a butcher knife and has been walking in the middle of the road as well".  Patient presents to the emergency department in the same state yelling and speaking to someone that is not present.        Past Medical History:  Diagnosis Date  . Kidney stones   . Schizophrenia [facility])     Patient Active Problem List   Diagnosis Date Noted  . Substance induced mood disorder ([place]) 12/05/2014  . Severe bipolar affective disorder with psychosis ([place]) 12/05/2014  . 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 PTSD (post-traumatic stress disorder) 01/13/2013  . Cocaine abuse ([place]) 01/13/2013    History reviewed. No pertinent surgical history.  Prior to Admission medications   Medication Sig Start Date End Date Taking? Authorizing Provider  chlorproMAZINE (THORAZINE) 200 MG tablet Take 1 tablet (200 mg total) by mouth 2 (two) times daily. For mood control 12/09/14   Lindell Spar I, NP  clonazePAM (KLONOPIN) 2 MG tablet Take 1 tablet (2 mg total) by mouth 3 (three) times daily. For severe anxiety symptoms 12/09/14   Lindell Spar I, NP  gabapentin (NEURONTIN) 300 MG capsule Take 1 capsule (300 mg total) by mouth 3 (three) times daily. For agitation 12/09/14   Lindell Spar I, NP    pantoprazole (PROTONIX) 40 MG tablet Take 1 tablet (40 mg total) by mouth daily. For acid reflux 12/09/14   Lindell Spar I, NP  QUEtiapine (SEROQUEL) 300 MG tablet Take 1 tablet (300 mg total) by mouth at bedtime. For mood control 12/09/14   Encarnacion Slates, NP    Allergies Patient has no known allergies.  History reviewed. No pertinent family history.  Social History Social History   Tobacco Use  . Smoking status: Current Every Day Smoker    Types: Cigarettes  Substance Use Topics  . Alcohol use: No  . Drug use: Yes    Types: Cocaine, Marijuana, Benzodiazepines    Constitutional: No fever/chills Eyes: No visual changes. ENT: No sore throat. Cardiovascular: Denies chest pain. Respiratory: Denies shortness of breath. Gastrointestinal: No abdominal pain.  No nausea, no vomiting.  No diarrhea.  No constipation. Genitourinary: Negative for dysuria. Musculoskeletal: Negative for neck pain.  Negative for back pain. Integumentary: Negative for rash. Neurological: Negative for headaches, focal weakness or numbness. Psychiatric:  Positive for agitation paranoid delusions   ____________________________________________   PHYSICAL EXAM:  VITAL SIGNS: ED Triage Vitals  Enc Vitals Group     BP 08/12/19 2257 (!) 125/92     Pulse Rate 08/12/19 2257 82     Resp 08/12/19 2257 18     Temp 08/12/19 2257 98 F (36.7 C)     Temp Source 08/12/19 2257 Oral     SpO2 08/12/19 2257 100 %     Weight 08/12/19 2248 90.7 kg (200 lb)     Height  08/12/19 2248 1.854 m (6\' 1" )     Head Circumference --      Peak Flow --      Pain Score 08/12/19 2248 0     Pain Loc --      Pain Edu? --      Excl. in GC? --     Constitutional: Agitated, Eyes: Conjunctivae are normal.  Head: Atraumatic. Mouth/Throat: Patient is wearing a mask. Neck: No stridor.  No meningeal signs.   Cardiovascular: Normal rate, regular rhythm. Good peripheral circulation. Grossly normal heart sounds. Respiratory: Normal  respiratory effort.  No retractions. Gastrointestinal: Soft and nontender. No distention.  Musculoskeletal: No lower extremity tenderness nor edema. No gross deformities of extremities. Neurologic:  Normal speech and language. No gross focal neurologic deficits are appreciated.  Skin:  Skin is warm, dry and intact. Psychiatric: Positive for agitation paranoid delusions visual hallucinations  ____________________________________________   LABS (all labs ordered are listed, but only abnormal results are displayed)  Labs Reviewed  COMPREHENSIVE METABOLIC PANEL - Abnormal; Notable for the following components:      Result Value   Glucose, Bld 102 (*)    Total Protein 8.5 (*)    Albumin 5.1 (*)    ALT 93 (*)    All other components within normal limits  CBC - Abnormal; Notable for the following components:   WBC 10.7 (*)    All other components within normal limits  ETHANOL  URINE DRUG SCREEN, QUALITATIVE ([facility] ONLY)   ____________________________________________   Procedures   ____________________________________________   INITIAL IMPRESSION / MDM / ASSESSMENT AND PLAN / ED COURSE  As part of my medical decision making, I reviewed the following data within the electronic MEDICAL RECORD NUMBER   53 year old male presented with above-stated history and physical exam secondary to involuntary commitment due to above-stated.  Given risk to self as well as staff patient was chemically sedated with Haldol 5 mg IM, Ativan 2 mg IM and Benadryl 50 g IM.  On reevaluation patient resting comfortably vital signs stable.  Awaiting psychiatry consultation  ____________________________________________  FINAL CLINICAL IMPRESSION(S) / ED DIAGNOSES  Final diagnoses:  Substance induced mood disorder (HCC)     MEDICATIONS GIVEN DURING THIS VISIT:  Medications  diphenhydrAMINE (BENADRYL) 50 MG/ML injection (50 mg  Given 08/12/19 2334)  haloperidol lactate (HALDOL) 5 MG/ML injection (5 mg   Given 08/12/19 2343)  LORazepam (ATIVAN) 2 MG/ML injection (  Given 08/12/19 2334)  ibuprofen (ADVIL) tablet 600 mg (600 mg Oral Given 08/13/19 0307)     ED Discharge Orders    None      *Please note:  Harold Brooks was evaluated in Emergency Department on 08/13/2019 for the symptoms described in the history of present illness. He was evaluated in the context of the global COVID-19 pandemic, which necessitated consideration that the patient might be at risk for infection with the SARS-CoV-2 virus that causes COVID-19. Institutional protocols and algorithms that pertain to the evaluation of patients at risk for COVID-19 are in a state of rapid change based on information released by regulatory bodies including the CDC and federal and state organizations. These policies and algorithms were followed during the patient's care in the ED.  Some ED evaluations and interventions may be delayed as a result of limited staffing during the pandemic.*  Note:  This document was prepared using Dragon voice recognition software and may include unintentional dictation errors.   08/15/2019, MD 08/13/19 [phone]

## 2019-08-12 NOTE — ED Triage Notes (Signed)
Pt brought to triage by [facility] under IVC. Pt with hx of schizophrenia and drug abuse. Pt talking to someone in triage who is not there.

## 2019-08-13 LAB — SARS CORONAVIRUS 2 (TAT 6-24 HRS): SARS Coronavirus 2: NEGATIVE

## 2019-08-13 LAB — ETHANOL: Alcohol, Ethyl (B): <10 mg/dL (ref <10)

## 2019-08-13 MED ORDER — IBUPROFEN 600 MG PO TABS
600.0000 mg | ORAL_TABLET | Freq: Once | ORAL | Status: AC
  Administered 2019-08-13: 600 mg via ORAL
  Filled 2019-08-13: qty 1

## 2019-08-13 MED ORDER — OLANZAPINE 5 MG PO TABS: 5.0000 mg | ORAL_TABLET | Freq: Two times a day (BID) | ORAL | 2 refills | Status: DC

## 2019-08-13 NOTE — ED Notes (Signed)
Hourly rounding reveals patient in room. No complaints, stable, in no acute distress. Q15 minute rounds and monitoring via Rover and Officer to continue.   

## 2019-08-13 NOTE — Consult Note (Signed)
[facility] Face-to-Face Psychiatry Consult   Reason for Consult: Hallucinations Referring Physician: Dr. Juliette Alcide Patient Identification: Harold Brooks MRN:  [id number] Principal Diagnosis: <principal problem not specified> Diagnosis:  Active Problems:   * No active hospital problems. *   Total Time spent with patient: 45 minutes  Subjective:   Harold Brooks is a 53 y.o. male patient who presented with agitation and hallucinations.  HPI: Patient is a 53 year old male with a history of schizophrenia who presented with agitation and hallucinations.  Originally he needed some medication to help calm down but after medication administration patient was calm and compliant and fell asleep.  Upon waking up this morning patient was clear and coherent no longer experiencing hallucinations no longer agitated.  Patient was apologetic for his actions and attributed his behavior to recent methamphetamine use.  Now no longer feeling intoxicated, patient feels ready to return home and denies any SI HI AVH depressed mood or manic symptoms.  Patient denies any paranoia.  Patient acknowledges having a provider prior to being in prison and will follow up with his provider in approximately 3 weeks.   Mother contacted for collateral, other feels that patient was agitated due to drug use.  She feels comfortable taking him home at this time reports that he is normally well behaved without agitated behaviors when not intoxicated.  Past Psychiatric History: Patient has a history of schizophrenia.  Was recently in prison and has an upcoming outpatient appointment in the coming weeks with his former provider Dr. Janeece Riggers  Risk to Self:  No Risk to Others:  No  prior Inpatient Therapy:  Yes Prior Outpatient Therapy:  Yes  Past Medical History:  Past Medical History:  Diagnosis Date  . Kidney stones   . Schizophrenia (HCC)    History reviewed. No pertinent surgical history. Family History: History reviewed. No  pertinent family history. Family Psychiatric  History: Denies Social History:  Social History   Substance and Sexual Activity  Alcohol Use No     Social History   Substance and Sexual Activity  Drug Use Yes  . Types: Cocaine, Marijuana, Benzodiazepines    Social History   Socioeconomic History  . Marital status: Unknown    Spouse name: Not on file  . Number of children: Not on file  . Years of education: Not on file  . Highest education level: Not on file  Occupational History  . Not on file  Tobacco Use  . Smoking status: Current Every Day Smoker    Types: Cigarettes  Substance and Sexual Activity  . Alcohol use: No  . Drug use: Yes    Types: Cocaine, Marijuana, Benzodiazepines  . Sexual activity: Yes    Birth control/protection: None  Other Topics Concern  . Not on file  Social History Narrative  . Not on file   Social Determinants of Health   Financial Resource Strain:   . Difficulty of Paying Living Expenses: Not on file  Food Insecurity:   . Worried About Programme researcher, broadcasting/film/video in the Last Year: Not on file  . Ran Out of Food in the Last Year: Not on file  Transportation Needs:   . Lack of Transportation (Medical): Not on file  . Lack of Transportation (Non-Medical): Not on file  Physical Activity:   . Days of Exercise per Week: Not on file  . Minutes of Exercise per Session: Not on file  Stress:   . Feeling of Stress : Not on file  Social Connections:   .  Frequency of Communication with Friends and Family: Not on file  . Frequency of Social Gatherings with Friends and Family: Not on file  . Attends Religious Services: Not on file  . Active Member of Clubs or Organizations: Not on file  . Attends BankerClub or Organization Meetings: Not on file  . Marital Status: Not on file   Additional Social History:    Allergies:  No Known Allergies  Labs:  Results for orders placed or performed during the hospital encounter of 08/12/19 (from the past 48 hour(s))   Comprehensive metabolic panel     Status: Abnormal   Collection Time: 08/12/19 10:53 PM  Result Value Ref Range   Sodium 137 135 - 145 mmol/L   Potassium 4.5 3.5 - 5.1 mmol/L   Chloride 102 98 - 111 mmol/L   CO2 23 22 - 32 mmol/L   Glucose, Bld 102 (H) 70 - 99 mg/dL   BUN 14 6 - 20 mg/dL   Creatinine, Ser 1.611.09 0.61 - 1.24 mg/dL   Calcium 9.4 8.9 - 09.610.3 mg/dL   Total Protein 8.5 (H) 6.5 - 8.1 g/dL   Albumin 5.1 (H) 3.5 - 5.0 g/dL   AST 34 15 - 41 U/L   ALT 93 (H) 0 - 44 U/L   Alkaline Phosphatase 97 38 - 126 U/L   Total Bilirubin 0.9 0.3 - 1.2 mg/dL   GFR calc non Af Amer >60 >60 mL/min   GFR calc Af Amer >60 >60 mL/min   Anion gap 12 5 - 15    Comment: Performed at [facility], [address] [place], [place] [address]  Ethanol     Status: None   Collection Time: 08/12/19 10:53 PM  Result Value Ref Range   Alcohol, Ethyl (B) <10 <10 mg/dL    Comment: (NOTE) Lowest detectable limit for serum alcohol is 10 mg/dL. For medical purposes only. Performed at [facility], [address] [place], [place] [address]   cbc     Status: Abnormal   Collection Time: 08/12/19 10:53 PM  Result Value Ref Range   WBC 10.7 (H) 4.0 - 10.5 K/uL   RBC 5.47 4.22 - 5.81 MIL/uL   Hemoglobin 17.0 13.0 - 17.0 g/dL   HCT 14.748.0 82.939.0 - 56.252.0 %   MCV 87.8 80.0 - 100.0 fL   MCH 31.1 26.0 - 34.0 pg   MCHC 35.4 30.0 - 36.0 g/dL   RDW 13.012.5 86.511.5 - 78.415.5 %   Platelets 399 150 - 400 K/uL   nRBC 0.0 0.0 - 0.2 %    Comment: Performed at [facility], [address] [place], [place] [address]    No current facility-administered medications for this encounter.   Current Outpatient Medications  Medication Sig Dispense Refill  . chlorproMAZINE (THORAZINE) 200 MG tablet Take 1 tablet (200 mg total) by mouth 2 (two) times daily. For mood control 60 tablet 0  . clonazePAM (KLONOPIN) 2 MG tablet Take 1 tablet (2 mg total) by mouth 3 (three) times daily. For severe anxiety symptoms  30 tablet 0  . gabapentin (NEURONTIN) 300 MG capsule Take 1 capsule (300 mg total) by mouth 3 (three) times daily. For agitation 90 capsule 0  . OLANZapine (ZYPREXA) 5 MG tablet Take 1 tablet (5 mg total) by mouth 2 (two) times daily at 10 AM and 5 PM. 30 tablet 2  . pantoprazole (PROTONIX) 40 MG tablet Take 1 tablet (40 mg total) by mouth daily. For acid reflux 30 tablet  0  . QUEtiapine (SEROQUEL) 300 MG tablet Take 1 tablet (300 mg total) by mouth at bedtime. For mood control 30 tablet 0    Musculoskeletal: Strength & Muscle Tone: within normal limits Gait & Station: normal Patient leans: N/A  Psychiatric Specialty Exam: Physical Exam  Review of Systems  Constitutional: Negative for activity change.  Eyes: Negative for discharge.  Endocrine: Negative for cold intolerance.  Allergic/Immunologic: Negative for environmental allergies.  Psychiatric/Behavioral: Positive for agitation, behavioral problems and hallucinations. Negative for confusion, decreased concentration, dysphoric mood, self-injury, sleep disturbance and suicidal ideas. The patient is nervous/anxious and is hyperactive.     Blood pressure (!) 125/92, pulse 82, temperature 98 F (36.7 C), temperature source Oral, resp. rate 18, height 6\' 1"  (1.854 m), weight 90.7 kg, SpO2 100 %.Body mass index is 26.39 kg/m.  General Appearance: Casual  Eye Contact:  Fair  Speech:  Clear and Coherent  Volume:  Normal  Mood:  Euthymic  Affect:  Appropriate  Thought Process:  Coherent  Orientation:  Full (Time, Place, and Person)  Thought Content:  Logical  Suicidal Thoughts:  No  Homicidal Thoughts:  No  Memory:  Recent;   Fair  Judgement:  Fair  Insight:  Fair  Psychomotor Activity:  Normal  Concentration:  Concentration: Fair  Recall:  [employer] of Knowledge:  Fair  Language:  Fair  Akathisia:  No  Handed:  Right  AIMS (if indicated):     Assets:  Communication Skills  ADL's:  Intact  Cognition:  WNL  Sleep:         Treatment Plan Summary: 53 year old male with history of schizophrenia who presents with hallucinations.  Patient was initially agitated but after given some time to calm down patient was no longer experiencing symptoms.  Patient's presentation can likely be attributed to his recent methamphetamine use.  Upon reevaluation this morning patient is clear, future oriented and willing to get help.  He has an outpatient appointment scheduled in the upcoming weeks and is no longer representing a danger to himself or others.  IVC will be rescinded patient will be discharged  Medications: Patient sent home with Zyprexa 5 mg twice daily for 20 days.  Diagnosis: Amphetamine abuse, schizophrenia  Disposition: No evidence of imminent risk to self or others at present.   Patient does not meet criteria for psychiatric inpatient admission. Supportive therapy provided about ongoing stressors. Discussed crisis plan, support from social network, calling 911, coming to the Emergency Department, and calling Suicide Hotline.  Dixie Dials, MD 08/13/2019 3:19 PM

## 2019-08-13 NOTE — ED Notes (Signed)
Hourly rounding reveals patient in room. No complaints, stable, in no acute distress. Q15 minute rounds and monitoring via Security Cameras to continue. 

## 2019-08-13 NOTE — Evaluation (Signed)
Patient sedated and unable to assess  after administration of  im injection of benedryl 50, ativan 2mg  and haldol 5mg .  Unable to assess at this time.

## 2019-08-13 NOTE — ED Notes (Signed)
Patient discharged home with mother, patient received discharge papers.  Patient received belongings and verbalized he has received all of his belongings. Patient appropriate and cooperative, Denies SI/HI AVH. Vital signs taken. NAD noted. 

## 2019-08-13 NOTE — ED Notes (Signed)
Pt ambulated without difficulty

## 2019-08-13 NOTE — ED Notes (Signed)
Pt walked over to [employer] by Garment/textile technologist and Solmon Ice, EDT

## 2019-08-13 NOTE — ED Provider Notes (Signed)
-----------------------------------------   9:31 AM on 08/13/2019 -----------------------------------------   Blood pressure (!) 125/92, pulse 82, temperature 98 F (36.7 C), temperature source Oral, resp. rate 18, height 6\' 1"  (1.854 m), weight 90.7 kg, SpO2 100 %.  The patient is calm and cooperative at this time.  There have been no acute events since the last update.  Awaiting disposition plan from Behavioral Medicine and/or Social Work team(s).    Nena Polio, MD 08/13/19 [phone]

## 2019-08-13 NOTE — Discharge Instructions (Addendum)
Please take your Zyprexa 5 mg twice a day and psychiatry wished.  Please return for any further problems.  Please follow-up with your providers as planned.

## 2019-09-01 ENCOUNTER — Other Ambulatory Visit: Payer: Self-pay

## 2019-09-01 ENCOUNTER — Emergency Department
Admission: EM | Admit: 2019-09-01 | Discharge: 2019-09-02 | Disposition: A | Discharge status: Home / Self Care | Payer: Medicaid Other | Attending: Emergency Medicine | Admitting: Emergency Medicine

## 2019-09-01 DIAGNOSIS — F209 Schizophrenia, unspecified: Secondary | ICD-10-CM | POA: Diagnosis not present

## 2019-09-01 DIAGNOSIS — F3189 Other bipolar disorder: Secondary | ICD-10-CM | POA: Diagnosis present

## 2019-09-01 DIAGNOSIS — Z20822 Contact with and (suspected) exposure to covid-19: Secondary | ICD-10-CM | POA: Insufficient documentation

## 2019-09-01 DIAGNOSIS — F1510 Other stimulant abuse, uncomplicated: Secondary | ICD-10-CM

## 2019-09-01 DIAGNOSIS — F17210 Nicotine dependence, cigarettes, uncomplicated: Secondary | ICD-10-CM | POA: Insufficient documentation

## 2019-09-01 DIAGNOSIS — F1994 Other psychoactive substance use, unspecified with psychoactive substance-induced mood disorder: Secondary | ICD-10-CM | POA: Diagnosis present

## 2019-09-01 DIAGNOSIS — Z79899 Other long term (current) drug therapy: Secondary | ICD-10-CM | POA: Insufficient documentation

## 2019-09-01 DIAGNOSIS — R440 Auditory hallucinations: Secondary | ICD-10-CM | POA: Diagnosis present

## 2019-09-01 MED ORDER — DROPERIDOL 2.5 MG/ML IJ SOLN
5.0000 mg | Freq: Once | INTRAMUSCULAR | Status: AC
  Administered 2019-09-01: 5 mg via INTRAMUSCULAR
  Filled 2019-09-01: qty 2
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
## 2019-09-02 DIAGNOSIS — F209 Schizophrenia, unspecified: Secondary | ICD-10-CM

## 2019-09-02 DIAGNOSIS — F1994 Other psychoactive substance use, unspecified with psychoactive substance-induced mood disorder: Secondary | ICD-10-CM

## 2019-09-02 DIAGNOSIS — F1510 Other stimulant abuse, uncomplicated: Secondary | ICD-10-CM | POA: Diagnosis not present

## 2019-09-02 LAB — ETHANOL: Alcohol, Ethyl (B): <10 mg/dL (ref <10)

## 2019-09-02 LAB — COMPREHENSIVE METABOLIC PANEL
ALT: 94 U/L — ABNORMAL HIGH (ref 0–44)
AST: 43 U/L — ABNORMAL HIGH (ref 15–41)
Albumin: 5 g/dL (ref 3.5–5.0)
Alkaline Phosphatase: 97 U/L (ref 38–126)
Anion gap: 15 (ref 5–15)
BUN: 16 mg/dL (ref 6–20)
CO2: 21 mmol/L — ABNORMAL LOW (ref 22–32)
Calcium: 9.3 mg/dL (ref 8.9–10.3)
Chloride: 106 mmol/L (ref 98–111)
Creatinine, Ser: 1.05 mg/dL (ref 0.61–1.24)
GFR calc Af Amer: >60 mL/min (ref >60)
GFR calc non Af Amer: >60 mL/min (ref >60)
Glucose, Bld: 109 mg/dL — ABNORMAL HIGH (ref 70–99)
Potassium: 3.7 mmol/L (ref 3.5–5.1)
Sodium: 142 mmol/L (ref 135–145)
Total Bilirubin: 1.2 mg/dL (ref 0.3–1.2)
Total Protein: 8.5 g/dL — ABNORMAL HIGH (ref 6.5–8.1)

## 2019-09-02 LAB — CBC
HCT: 46.7 % (ref 39.0–52.0)
Hemoglobin: 15.8 g/dL (ref 13.0–17.0)
MCH: 31.2 pg (ref 26.0–34.0)
MCHC: 33.8 g/dL (ref 30.0–36.0)
MCV: 92.1 fL (ref 80.0–100.0)
Platelets: 384 10*3/uL (ref 150–400)
RBC: 5.07 MIL/uL (ref 4.22–5.81)
RDW: 12.6 % (ref 11.5–15.5)
WBC: 11 10*3/uL — ABNORMAL HIGH (ref 4.0–10.5)
nRBC: 0 % (ref 0.0–0.2)

## 2019-09-02 LAB — ACETAMINOPHEN LEVEL: Acetaminophen (Tylenol), Serum: <10 ug/mL — ABNORMAL LOW (ref 10–30)

## 2019-09-02 LAB — SALICYLATE LEVEL: Salicylate Lvl: <7 mg/dL — ABNORMAL LOW (ref 7.0–30.0)

## 2019-09-02 LAB — RESPIRATORY PANEL BY RT PCR (FLU A&B, COVID)
Influenza A by PCR: NEGATIVE
Influenza B by PCR: NEGATIVE
SARS Coronavirus 2 by RT PCR: NEGATIVE

## 2019-09-02 MED ORDER — CHLORPROMAZINE HCL 100 MG PO TABS
200.0000 mg | ORAL_TABLET | Freq: Two times a day (BID) | ORAL | Status: DC
  Administered 2019-09-02: 12:00:00 200 mg via ORAL
  Filled 2019-09-02 (×2): qty 2

## 2019-09-02 MED ORDER — GABAPENTIN 300 MG PO CAPS
300.0000 mg | ORAL_CAPSULE | Freq: Three times a day (TID) | ORAL | Status: DC
  Administered 2019-09-02: 12:00:00 300 mg via ORAL
  Filled 2019-09-02: qty 1

## 2019-09-02 MED ORDER — PANTOPRAZOLE SODIUM 40 MG PO TBEC
40.0000 mg | DELAYED_RELEASE_TABLET | Freq: Every day | ORAL | Status: DC
  Administered 2019-09-02: 12:00:00 40 mg via ORAL
  Filled 2019-09-02: qty 1

## 2019-09-02 MED ORDER — OLANZAPINE 5 MG PO TABS
5.0000 mg | ORAL_TABLET | Freq: Two times a day (BID) | ORAL | Status: DC
  Administered 2019-09-02: 12:00:00 5 mg via ORAL
  Filled 2019-09-02: qty 1

## 2019-09-02 NOTE — ED Notes (Signed)
Psych at bedside.

## 2019-09-02 NOTE — ED Notes (Signed)
Per psych pt likely to be discharged

## 2019-09-02 NOTE — BH Assessment (Signed)
Clinician called pt's nurse in an effort to complete pt's BH Assessment; clinician was unable to make contact w/ pt's nurse. TTS will attempt at a later time.

## 2019-09-02 NOTE — ED Notes (Signed)
Report given to Ashley, RN

## 2019-09-02 NOTE — ED Notes (Signed)
SOC cart placed in room 

## 2019-09-02 NOTE — BH Assessment (Signed)
Assessment Note  Harold Brooks is an 54 y.o. male who presents to the ER due to paranoia and thoughts of someone trying to kill him. Patient is well known to the ER due to similar presentations, as a result of the use of mind-altering substances.  With this ER visit, patient admits to the use of methamphetamines and acknowledges, when he does it, "I'm not here but (I) come back to reality after I come down (no longer impaired)." Patient states he has an upcoming appointment with his outpatient provider but doesn't' know if he going to make it because of a lack on money. When offered resources for other providers, he declined it. He states, they have been with the same psychiatrist for years and they know him. He also reports, he doesn't want to tell his story over again and have the provider "figure out" what to do for him.  During the interview, the patient was calm, cooperative and pleasant. He was able to provide appropriate answers to the questions. Patient denies SI/HI. He has a history of A/H but currently denies having any at the time of the assessment. Patient currently lives with his mother and reports he can return upon discharged.  Diagnosis: Amphetamines Use Disorder & Bipolar Affective Disorder with Psychosis.  Past Medical History:  Past Medical History:  Diagnosis Date  . Kidney stones   . Schizophrenia ([place])     History reviewed. No pertinent surgical history.  Family History: No family history on file.  Social History:  reports that he has been smoking cigarettes. He does not have any smokeless tobacco history on file. He reports current drug use. Drugs: Cocaine, Marijuana, and Benzodiazepines. He reports that he does not drink alcohol.  Additional Social History:  Alcohol / Drug Use Pain Medications: See PTA Prescriptions: See PTA Over the Counter: See PTA History of alcohol / drug use?: Yes Longest period of sobriety (when/how long): Unable to quantify Negative  Consequences of Use: Personal relationships, Work / School Substance #1 Name of Substance 1: Alcohol Substance #2 Name of Substance 2: Methamphetamine Substance #3 Name of Substance 3: Cocaine  CIWA: CIWA-Ar BP: (!) 153/107 Pulse Rate: (!) 118 COWS:    Allergies: No Known Allergies  Home Medications: (Not in a hospital admission)   OB/GYN Status:  No LMP for male patient.  General Assessment Data Assessment unable to be completed: Yes Reason for not completing assessment: Unable to make contact w/ pt's nurse when called Location of Assessment: [facility] ED TTS Assessment: In system Is this a Tele or Face-to-Face Assessment?: Face-to-Face Is this an Initial Assessment or a Re-assessment for this encounter?: Initial Assessment Patient Accompanied by:: N/A Language Other than English: No Living Arrangements: Other (Comment)(Private Home) What gender do you identify as?: Male Marital status: Single Pregnancy Status: No Living Arrangements: Parent Can pt return to current living arrangement?: Yes Admission Status: Involuntary Petitioner: ED Attending Is patient capable of signing voluntary admission?: No(Under IVC) Referral Source: Self/Family/Friend Insurance type: None  Medical Screening Exam ([place]) Medical Exam completed: Yes  Crisis Care Plan Living Arrangements: Parent Legal Guardian: Other:(Self) Name of Psychiatrist: Dr. Hilma Favors Care Name of Therapist: Reports of none  Education Status Is patient currently in school?: No Is the patient employed, unemployed or receiving disability?: Unemployed  Risk to self with the past 6 months Suicidal Ideation: No Has patient been a risk to self within the past 6 months prior to admission? : No Suicidal Intent: No Has patient had  any suicidal intent within the past 6 months prior to admission? : No Is patient at risk for suicide?: No Suicidal Plan?: No Has patient had any suicidal plan within the  past 6 months prior to admission? : No Access to Means: No What has been your use of drugs/alcohol within the last 12 months?: Methamphetamine, alcohol, cocaine and cannabis Previous Attempts/Gestures: No How many times?: 0 Other Self Harm Risks: Active Drug use Triggers for Past Attempts: None known Intentional Self Injurious Behavior: None Family Suicide History: No Recent stressful life event(s): Other (Comment) Persecutory voices/beliefs?: No Depression: Yes Depression Symptoms: Isolating, Guilt, Feeling angry/irritable Substance abuse history and/or treatment for substance abuse?: Yes Suicide prevention information given to non-admitted patients: Not applicable  Risk to Others within the past 6 months Homicidal Ideation: No Does patient have any lifetime risk of violence toward others beyond the six months prior to admission? : No Thoughts of Harm to Others: No Current Homicidal Intent: No Current Homicidal Plan: No Access to Homicidal Means: No Identified Victim: Reports of none History of harm to others?: No Assessment of Violence: None Noted Violent Behavior Description: Reports of none Does patient have access to weapons?: No Criminal Charges Pending?: No Does patient have a court date: No Is patient on probation?: No  Psychosis Hallucinations: None noted Delusions: None noted  Mental Status Report Appearance/Hygiene: Unremarkable, In scrubs Eye Contact: Good Motor Activity: Freedom of movement, Unremarkable Speech: Logical/coherent, Unremarkable Level of Consciousness: Alert Mood: Sad, Pleasant Affect: Appropriate to circumstance, Sad Anxiety Level: None Thought Processes: Coherent, Relevant Judgement: Unimpaired Orientation: Person, Place, Time, Situation, Appropriate for developmental age Obsessive Compulsive Thoughts/Behaviors: None  Cognitive Functioning Concentration: Normal Memory: Remote Intact, Recent Impaired Is patient IDD: No Insight:  Fair Impulse Control: Fair Appetite: Good Have you had any weight changes? : No Change Sleep: No Change Total Hours of Sleep: 8 Vegetative Symptoms: None  ADLScreening [facility] Assessment Services) Patient's cognitive ability adequate to safely complete daily activities?: Yes Patient able to express need for assistance with ADLs?: Yes Independently performs ADLs?: Yes (appropriate for developmental age)  Prior Inpatient Therapy Prior Inpatient Therapy: Yes Prior Therapy Dates: 08/2013, 12/2012, 09/2009, 09/2008 & 08/2008 Prior Therapy Facilty/Provider(s): [facility] Reason for Treatment: Substance Use Disorder and Depression  Prior Outpatient Therapy Prior Outpatient Therapy: Yes Prior Therapy Dates: 2015 Prior Therapy Facilty/Provider(s): [facility] Reason for Treatment: Substance Abuse and Depression Does patient have an ACCT team?: No Does patient have Intensive In-House Services?  : No Does patient have [facility] services? : No Does patient have P4CC services?: No  ADL Screening (condition at time of admission) Patient's cognitive ability adequate to safely complete daily activities?: Yes Is the patient deaf or have difficulty hearing?: No Does the patient have difficulty seeing, even when wearing glasses/contacts?: No Does the patient have difficulty concentrating, remembering, or making decisions?: No Patient able to express need for assistance with ADLs?: Yes Does the patient have difficulty dressing or bathing?: No Independently performs ADLs?: Yes (appropriate for developmental age) Does the patient have difficulty walking or climbing stairs?: No Weakness of Legs: None Weakness of Arms/Hands: None  Home Assistive Devices/Equipment Home Assistive Devices/Equipment: None  Therapy Consults (therapy consults require a physician order) PT Evaluation Needed: No OT Evalulation Needed: No SLP Evaluation Needed: No Abuse/Neglect  Assessment (Assessment to be complete while patient is alone) Abuse/Neglect Assessment Can Be Completed: Yes Physical Abuse: Denies Verbal Abuse: Denies Sexual Abuse: Denies Exploitation of patient/patient's resources:  Denies Self-Neglect: Denies Values / Beliefs Cultural Requests During Hospitalization: None Spiritual Requests During Hospitalization: None Consults Spiritual Care Consult Needed: No Transition of Care Team Consult Needed: No  Child/Adolescent Assessment Running Away Risk: Denies(Patient is an adult)  Disposition:  Disposition Initial Assessment Completed for this Encounter: Yes  On Site Evaluation by:   Reviewed with Physician:    Lilyan Gilford MS, LCAS, [facility], NCC Therapeutic Triage Specialist 09/02/2019 12:12 PM

## 2019-09-02 NOTE — ED Notes (Signed)
TTS at bedside. 

## 2019-09-02 NOTE — Discharge Instructions (Signed)
Amphetamines Use Disorder Amphetamines use disorder is a condition in which the use of amphetamines disrupts daily life. Amphetamines are one type of drug in a group of powerful drugs known as stimulants. Common names for amphetamines include speed and crank. Amphetamines have many medical uses, but these drugs are often misused because of their effects. These effects include:  A feeling of extreme pleasure (euphoria).  Alertness or increased attention.  A high energy level.  Loss of appetite. Misuse of amphetamines can lead to a substance use disorder and can cause problems with physical and mental health, including:  Unplanned weight loss.  High blood pressure and heart rate.  Diseases caused by infections, such as hepatitis or HIV.  Anxiety, depression, mood swings, or sleep problems.  Violent behavior. Amphetamines use disorder can be dangerous. Because amphetamines increase your blood pressure and heart rate, misuse can lead to a heart attack or stroke. Amphetamines can also make your heart rate irregular, cause seizures, and raise your body temperature. The problems that these drugs cause can be life-threatening. What are the causes? This condition is caused by misusing amphetamines for a period of time, such as by taking them for reasons other than to treat a diagnosed problem. Many people start using amphetamines because these drugs make them feel good. Over time, they get addicted to them.  What increases the risk? This condition is more likely to develop in people who:  Misuse other drugs or alcohol.  Have problems with mood or behavior.  Have a mental illness, such as depression, post-traumatic stress disorder, or antisocial personality disorder.  Begin use at an early age, such as during their teenage years. What are the signs or symptoms? Symptoms of this condition include:  Using amphetamines for longer periods of time or in higher amounts than you want to.  Being  unable to slow down or stop your use of the drug.  Craving amphetamines.  Spending an abnormal amount of time getting amphetamines, using them, or recovering from their effects.  Using amphetamines in a way that interferes with work, school, social activities, or personal relationships.  Giving up or cutting down on important life activities because of amphetamine use.  Using amphetamines when it is dangerous, such as when driving a car.  Continuing to use the drug even after it has led to problems such as: ? Physical or mental health problems. ? Legal or financial troubles. ? Job loss. ? Broken relationships.  Needing more and more of the drug to get the same effect (building up a tolerance).  Experiencing unpleasant symptoms if you do not use the amphetamine (withdrawal). Some symptoms of withdrawal include: ? Fast or irregular heartbeats (palpitations). ? Chest pain. ? Nausea, vomiting, diarrhea, or abdominal cramping. ? Restlessness and mood changes. ? Tiredness (fatigue). ? Sleep changes or bad dreams. ? Increased appetite. How is this diagnosed? This condition is diagnosed based on:  A physical exam.  Your history of amphetamine use.  Your symptoms. This includes: ? How amphetamine use affects your life. ? Changes in personality, behaviors, and mood. ? Having at least two symptoms of amphetamines use disorder within a 76-month period. How severe the condition is depends on how many symptoms you have. ? Health issues related to using amphetamines.  Blood or urine tests to screen for drugs. How is this treated? The first goal of treatment is to stop your use of amphetamines. This must be done safely and may involve taking medicines to lessen withdrawal symptoms. Treatment may  also involve:  Taking part in group and individual counseling from mental health providers who have experience with substance use disorder.  Staying at a residential treatment center for  several days or weeks.  Attending daily counseling sessions at a treatment center.  Taking medicines as told by your health care provider that: ? Ease symptoms and prevent complications during withdrawal. ? Block cravings and block the good feeling that you get from using amphetamines. ? Treat other mental health issues, such as depression or anxiety. ? Reduce agitation.  Participating in a support group to share your experience with others who are going through the same thing. Recovery can be a long process. Many people who undergo treatment start using the drug again after stopping (relapse). If you relapse, it does not mean that treatment will not work. Follow these instructions at home: Medicines  Take over-the-counter and prescription medicines only as told by your health care provider.  Check with your health care provider before starting any new medicines, herbs, or supplements. General instructions  Do not use any drugs or alcohol.  Avoid people and activities that trigger your use of amphetamines.  Learn and practice techniques for managing stress.  Have a plan for vulnerable moments. Get phone numbers of those who are willing to help and who are committed to your recovery.  Attend support groups regularly. These groups provide emotional support, advice, and guidance.  Do not use any products that contain nicotine or tobacco, such as cigarettes, e-cigarettes, and chewing tobacco. If you need help quitting, ask your health care provider.  Keep all follow-up visits as told by your health care provider. This is important. This includes continuing to work with therapists and support groups. Where to find more information  [employer] on Drug Abuse: http://gonzalez-rivas.net/  Substance Abuse and Mental Health Services Administration: RockToxic.pl  Narcotics Anonymous: DestructiveBlog.cz Contact a health care provider if:  Your symptoms get worse.  You use amphetamines again.  You  cannot take medicines as told. Get help right away if:  You have serious thoughts about hurting yourself or others.  You may have taken too much of an amphetamine (overdosed). Symptoms of an overdose include: ? Chest pain. ? A seizure.  You have any symptoms of a stroke. "BE FAST" is an easy way to remember the main warning signs of a stroke: ? B - Balance. Signs are dizziness, sudden trouble walking, or loss of balance. ? E - Eyes. Signs are trouble seeing or a sudden change in vision. ? F - Face. Signs are sudden weakness or numbness of the face, or the face or eyelid drooping on one side. ? A - Arms. Signs are weakness or numbness in an arm. This happens suddenly and usually on one side of the body. ? S - Speech. Signs are sudden trouble speaking, slurred speech, or trouble understanding what people say. ? T - Time. Time to call emergency services. Write down what time symptoms started. These symptoms may represent a serious problem that is an emergency. Do not wait to see if the symptoms will go away. Get medical help right away. Call your local emergency services (911 in the [place] Do not drive yourself to the hospital. If you ever feel like you may hurt yourself or others, or have thoughts about taking your own life, get help right away. You can go to your nearest emergency department or call:  Your local emergency services (911 in the [place]  A suicide crisis helpline, such as  the National Suicide Prevention Lifeline at 539-874-3231. This is open 24 hours a day. Summary  Amphetamines use disorder is a condition in which the use of amphetamines disrupts daily life.  Amphetamines are one type of drug in a group of powerful drugs known as stimulants.  Recovery can be a long process. If you have a relapse, that does not mean that treatment will not work.  Avoid people and activities that trigger your use of amphetamines, and be sure to attend support groups regularly. This  information is not intended to replace advice given to you by your health care provider. Make sure you discuss any questions you have with your health care provider. Document Revised: 01/17/2019 Document Reviewed: 01/17/2019 Elsevier Patient Education  2020 [employer].

## 2019-09-02 NOTE — Consult Note (Addendum)
[facility] Face-to-Face Psychiatry Consult   Reason for Consult:Psych evaluation Referring Physician:  Dr. Don Perking Patient Identification: Harold Brooks MRN:  [id number] Principal Diagnosis: Schizophrenia, chronic with acute exacerbation (HCC) Diagnosis:  Principal Problem:   Schizophrenia, chronic with acute exacerbation (HCC)   Total Time spent with patient: 1 hour  Subjective:  (per er-nurse) Harold Brooks is a 54 y.o. male patient admitted with.  Patient brought in under IVC for paranoia and hallucinations. Patient was having conversations with people that aren't there, reporting he's being stabbed, running around house with a knife because he thought someone is trying to break in. Patient repeatedly stating "please don't kill me sir".  HPI: Harold Brooks, 54 y.o., male patient seen face to face by this provider; chart reviewed and consulted with Dr. Don Perking on 09/02/19.  On evaluation Harold Brooks reports that someone is out to kill him. At this time patient states that he does not have a psychiatrist nor does he have a therapist due to lack of insurance.  Patient is well known to this ER as he frequents it often, particularly after a night of illegal drug use such as crack and or meth.  Patient wad here Aug 13, 2019 with a similar presentation and was discharged after he was given an injection in the er to calm down.  During evaluation Harold Brooks is lying in bed; he is alert/oriented x 4; delusional/combative; and mood congruent with affect.  Patient was yelling upon entry into the er screeming "please dont kill me".  Police reported that patient addressed him as his drug dealer and begged him not to kill him because he stole the "dope". Patient thought police was a his dealer coming to collect.  Patient has extreme paranoia and evn motioned his body as if he was being stabbed. Patient's mom is his support but at this time she is at a loss as to what to do as patients  behavior are becoming more extreme and unmanageable when he uses drugs (meth and crack).    Patient was given inapsine 5mg  at injection at 2353 and is now in a more cooperative state of being. He denies suicidal/self-harm/homicidal ideation,he is still paranoid and believes someone is out to kill him.  Recommendation:  Overnight observation, and reassessment in the am.  Past Psychiatric History: yes, schizophrenia  Risk to Self:  no Risk to Others:  no Prior Inpatient Therapy:  yes Prior Outpatient Therapy:  yes  Past Medical History:  Past Medical History:  Diagnosis Date  . Kidney stones   . Schizophrenia (HCC)    History reviewed. No pertinent surgical history. Family History: No family history on file. Family Psychiatric  History: unknown Social History:  Social History   Substance and Sexual Activity  Alcohol Use No     Social History   Substance and Sexual Activity  Drug Use Yes  . Types: Cocaine, Marijuana, Benzodiazepines   Comment: patient reported to BP he is using meth now, not crack    Social History   Socioeconomic History  . Marital status: Unknown    Spouse name: Not on file  . Number of children: Not on file  . Years of education: Not on file  . Highest education level: Not on file  Occupational History  . Not on file  Tobacco Use  . Smoking status: Current Every Day Smoker    Types: Cigarettes  Substance and Sexual Activity  . Alcohol use: No  . Drug use: Yes  Types: Cocaine, Marijuana, Benzodiazepines    Comment: patient reported to BP he is using meth now, not crack  . Sexual activity: Yes    Birth control/protection: None  Other Topics Concern  . Not on file  Social History Narrative  . Not on file   Social Determinants of Health   Financial Resource Strain:   . Difficulty of Paying Living Expenses: Not on file  Food Insecurity:   . Worried About Programme researcher, broadcasting/film/video in the Last Year: Not on file  . Ran Out of Food in the Last  Year: Not on file  Transportation Needs:   . Lack of Transportation (Medical): Not on file  . Lack of Transportation (Non-Medical): Not on file  Physical Activity:   . Days of Exercise per Week: Not on file  . Minutes of Exercise per Session: Not on file  Stress:   . Feeling of Stress : Not on file  Social Connections:   . Frequency of Communication with Friends and Family: Not on file  . Frequency of Social Gatherings with Friends and Family: Not on file  . Attends Religious Services: Not on file  . Active Member of Clubs or Organizations: Not on file  . Attends Banker Meetings: Not on file  . Marital Status: Not on file   Additional Social History:    Allergies:  No Known Allergies  Labs:  Results for orders placed or performed during the hospital encounter of 09/01/19 (from the past 48 hour(s))  Comprehensive metabolic panel     Status: Abnormal   Collection Time: 09/02/19 12:15 AM  Result Value Ref Range   Sodium 142 135 - 145 mmol/L   Potassium 3.7 3.5 - 5.1 mmol/L   Chloride 106 98 - 111 mmol/L   CO2 21 (L) 22 - 32 mmol/L   Glucose, Bld 109 (H) 70 - 99 mg/dL   BUN 16 6 - 20 mg/dL   Creatinine, Ser 7.84 0.61 - 1.24 mg/dL   Calcium 9.3 8.9 - 69.6 mg/dL   Total Protein 8.5 (H) 6.5 - 8.1 g/dL   Albumin 5.0 3.5 - 5.0 g/dL   AST 43 (H) 15 - 41 U/L   ALT 94 (H) 0 - 44 U/L   Alkaline Phosphatase 97 38 - 126 U/L   Total Bilirubin 1.2 0.3 - 1.2 mg/dL   GFR calc non Af Amer >60 >60 mL/min   GFR calc Af Amer >60 >60 mL/min   Anion gap 15 5 - 15    Comment: Performed at [facility], [address] [place], [place] [address]  Ethanol     Status: None   Collection Time: 09/02/19 12:15 AM  Result Value Ref Range   Alcohol, Ethyl (B) <10 <10 mg/dL    Comment: (NOTE) Lowest detectable limit for serum alcohol is 10 mg/dL. For medical purposes only. Performed at [facility], [address] [place], [place] [address]   Salicylate level      Status: Abnormal   Collection Time: 09/02/19 12:15 AM  Result Value Ref Range   Salicylate Lvl <7.0 (L) 7.0 - 30.0 mg/dL    Comment: Performed at [facility], [address] [place], [place] [address]  Acetaminophen level     Status: Abnormal   Collection Time: 09/02/19 12:15 AM  Result Value Ref Range   Acetaminophen (Tylenol), Serum <10 (L) 10 - 30 ug/mL    Comment: (NOTE) Therapeutic concentrations vary significantly. A range of 10-30 ug/mL  may be an effective concentration for many patients. However, some  are best treated at concentrations outside of this range. Acetaminophen concentrations >150 ug/mL at 4 hours after ingestion  and >50 ug/mL at 12 hours after ingestion are often associated with  toxic reactions. Performed at [facility], [address] [place], [place] [address]   cbc     Status: Abnormal   Collection Time: 09/02/19 12:15 AM  Result Value Ref Range   WBC 11.0 (H) 4.0 - 10.5 K/uL   RBC 5.07 4.22 - 5.81 MIL/uL   Hemoglobin 15.8 13.0 - 17.0 g/dL   HCT 64.3 32.9 - 51.8 %   MCV 92.1 80.0 - 100.0 fL   MCH 31.2 26.0 - 34.0 pg   MCHC 33.8 30.0 - 36.0 g/dL   RDW 84.1 66.0 - 63.0 %   Platelets 384 150 - 400 K/uL   nRBC 0.0 0.0 - 0.2 %    Comment: Performed at [facility], [address] [place], [place] [address]    No current facility-administered medications for this encounter.   Current Outpatient Medications  Medication Sig Dispense Refill  . chlorproMAZINE (THORAZINE) 200 MG tablet Take 1 tablet (200 mg total) by mouth 2 (two) times daily. For mood control 60 tablet 0  . clonazePAM (KLONOPIN) 2 MG tablet Take 1 tablet (2 mg total) by mouth 3 (three) times daily. For severe anxiety symptoms 30 tablet 0  . gabapentin (NEURONTIN) 300 MG capsule Take 1 capsule (300 mg total) by mouth 3 (three) times daily. For agitation 90 capsule 0  . OLANZapine (ZYPREXA) 5 MG tablet Take 1 tablet (5 mg total) by mouth 2 (two) times daily  at 10 AM and 5 PM. 30 tablet 2  . pantoprazole (PROTONIX) 40 MG tablet Take 1 tablet (40 mg total) by mouth daily. For acid reflux 30 tablet 0  . QUEtiapine (SEROQUEL) 300 MG tablet Take 1 tablet (300 mg total) by mouth at bedtime. For mood control 30 tablet 0    Musculoskeletal: Strength & Muscle Tone: within normal limits Gait & Station: unsteady Patient leans: N/A  Psychiatric Specialty Exam: Physical Exam  Nursing note and vitals reviewed. Constitutional: He is oriented to person, place, and time. He appears well-developed.  Eyes: Pupils are equal, round, and reactive to light.  Cardiovascular: Normal rate.  Respiratory: He is in respiratory distress.  GI: Soft.  Musculoskeletal:        General: Normal range of motion.     Cervical back: Normal range of motion.  Neurological: He is alert and oriented to person, place, and time.  Skin: Skin is warm and dry.  Psychiatric: His affect is angry, labile and inappropriate. His speech is rapid and/or pressured. He is agitated, aggressive, hyperactive and actively hallucinating. Thought content is paranoid and delusional. Cognition and memory are impaired. He expresses impulsivity and inappropriate judgment. He expresses homicidal ideation.    Review of Systems  Psychiatric/Behavioral: Positive for agitation and behavioral problems. The patient is nervous/anxious.   All other systems reviewed and are negative.   Blood pressure (!) 153/107, pulse (!) 118, temperature 98.9 F (37.2 C), resp. rate 20, weight 90.7 kg, SpO2 97 %.Body mass index is 26.38 kg/m.  General Appearance: Disheveled  Eye Contact:  Fair  Speech:  Normal Rate  Volume:  Increased  Mood:  Anxious and Irritable  Affect:  Inappropriate  Thought Process:  Disorganized and Descriptions of Associations: Loose  Orientation:  Negative  Thought Content:  Delusions and  Paranoid Ideation  Suicidal Thoughts:  No  Homicidal Thoughts:  No  Memory:  Immediate;   Poor   Judgement:  Poor  Insight:  Lacking  Psychomotor Activity:  Increased  Concentration:  Concentration: Poor  Recall:  [place] of Knowledge:  Fair  Language:  Good  Akathisia:  NA  Handed:  Right  AIMS (if indicated):     Assets:  Social Support  ADL's:  Intact  Cognition:  WNL  Sleep:   reports "not too good"     Treatment Plan Summary: Reassess in the am  Disposition: Reassess in the am  Deloria Lair, NP 09/02/2019 2:37 AM

## 2019-09-02 NOTE — ED Provider Notes (Signed)
[facility] Emergency Department Provider Note  ____________________________________________  Time seen: Approximately 6:06 AM  I have reviewed the triage vital signs and the nursing notes.   HISTORY  Chief Complaint Hallucinations and Paranoid  Level 5 caveat:  Portions of the history and physical were unable to be obtained due to paranoia   HPI Harold Brooks is a 54 y.o. male with a history of schizophrenia who was brought in under IVC for paranoia and hallucinations.  Patient has been having auditory and visual hallucinations.  He says of the voices are telling him that everybody is out to kill him.  He thinks everybody is a drug dealer trying to kill him.  He was chasing his mother around with knife because he thought somebody was trying to break into the house.  Patient really fears for his safety and is afraid that people are trying to kill him.  His mother called 911.   He denies any medical complaints  Past Medical History:  Diagnosis Date  . Kidney stones   . Schizophrenia [facility])     Patient Active Problem List   Diagnosis Date Noted  . Schizophrenia, chronic with acute exacerbation (HCC) 09/02/2019  . Substance induced mood disorder (HCC) 12/05/2014  . 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 PTSD (post-traumatic stress disorder) 01/13/2013  . Cocaine abuse (HCC) 01/13/2013    History reviewed. No pertinent surgical history.  Prior to Admission medications   Medication Sig Start Date End Date Taking? Authorizing Provider  chlorproMAZINE (THORAZINE) 200 MG tablet Take 1 tablet (200 mg total) by mouth 2 (two) times daily. For mood control 12/09/14   Armandina Stammer I, NP  clonazePAM (KLONOPIN) 2 MG tablet Take 1 tablet (2 mg total) by mouth 3 (three) times daily. For severe anxiety symptoms 12/09/14   Armandina Stammer I, NP  gabapentin (NEURONTIN) 300 MG capsule Take 1  capsule (300 mg total) by mouth 3 (three) times daily. For agitation 12/09/14   Armandina Stammer I, NP  OLANZapine (ZYPREXA) 5 MG tablet Take 1 tablet (5 mg total) by mouth 2 (two) times daily at 10 AM and 5 PM. 08/13/19 08/12/20  Arnaldo Natal, MD  pantoprazole (PROTONIX) 40 MG tablet Take 1 tablet (40 mg total) by mouth daily. For acid reflux 12/09/14   Armandina Stammer I, NP  QUEtiapine (SEROQUEL) 300 MG tablet Take 1 tablet (300 mg total) by mouth at bedtime. For mood control 12/09/14   Sanjuana Kava, NP    Allergies Patient has no known allergies.  No family history on file.  Social History Social History   Tobacco Use  . Smoking status: Current Every Day Smoker    Types: Cigarettes  Substance Use Topics  . Alcohol use: No  . Drug use: Yes    Types: Cocaine, Marijuana, Benzodiazepines    Comment: patient reported to BP he is using meth now, not crack    Review of Systems  Constitutional: Negative for fever. Eyes: Negative for visual changes. ENT: Negative for sore throat. Neck: No neck pain  Cardiovascular: Negative for chest pain. Respiratory: Negative for shortness of breath. Gastrointestinal: Negative for abdominal pain, vomiting or diarrhea. Genitourinary: Negative for dysuria. Musculoskeletal: Negative for back pain. Skin: Negative for rash. Neurological: Negative for headaches, weakness or numbness. Psych: No SI or HI. + hallucinations  ____________________________________________   PHYSICAL EXAM:  VITAL SIGNS: ED Triage Vitals  Enc Vitals Group  BP 09/01/19 2325 (!) 153/107     Pulse Rate 09/01/19 2325 (!) 118     Resp 09/01/19 2325 20     Temp 09/01/19 2325 98.9 F (37.2 C)     Temp src --      SpO2 09/01/19 2325 97 %     Weight 09/01/19 2323 199 lb 15.3 oz (90.7 kg)     Height --      Head Circumference --      Peak Flow --      Pain Score 09/01/19 2323 0     Pain Loc --      Pain Edu? --      Excl. in GC? --     Constitutional: Alert and  oriented, arrives under custody screaming "please don't kill me." No apparent distress. HEENT:      Head: Normocephalic and atraumatic.         Eyes: Conjunctivae are normal. Sclera is non-icteric.       Mouth/Throat: Mucous membranes are moist.       Neck: Supple with no signs of meningismus. Cardiovascular: Regular rate and rhythm.  Respiratory: Normal respiratory effort.  Gastrointestinal: Soft, non tender, and non distended. Musculoskeletal: No edema, cyanosis, or erythema of extremities. Neurologic: Normal speech and language. Face is symmetric. Moving all extremities. No gross focal neurologic deficits are appreciated. Skin: Skin is warm, dry and intact. No rash noted. Psychiatric: Agitated, paranoid, auditory and visual hallucinations.  ____________________________________________   LABS (all labs ordered are listed, but only abnormal results are displayed)  Labs Reviewed  COMPREHENSIVE METABOLIC PANEL - Abnormal; Notable for the following components:      Result Value   CO2 21 (*)    Glucose, Bld 109 (*)    Total Protein 8.5 (*)    AST 43 (*)    ALT 94 (*)    All other components within normal limits  SALICYLATE LEVEL - Abnormal; Notable for the following components:   Salicylate Lvl <7.0 (*)    All other components within normal limits  ACETAMINOPHEN LEVEL - Abnormal; Notable for the following components:   Acetaminophen (Tylenol), Serum <10 (*)    All other components within normal limits  CBC - Abnormal; Notable for the following components:   WBC 11.0 (*)    All other components within normal limits  RESPIRATORY PANEL BY RT PCR (FLU A&B, COVID)  ETHANOL  URINE DRUG SCREEN, QUALITATIVE ([facility] ONLY)   ____________________________________________  EKG  none  ____________________________________________  RADIOLOGY  none  ____________________________________________   PROCEDURES  Procedure(s) performed: None Procedures Critical Care performed:   None ____________________________________________   INITIAL IMPRESSION / ASSESSMENT AND PLAN / ED COURSE   54 y.o. male with a history of schizophrenia who was brought in under IVC for paranoia and hallucinations.  Patient having visual and auditory hallucinations, very paranoid.  At this time, due to concerns for patient and other safety he will remain under IVC.  Labs for medical clearance showed no acute findings.  Patient is awaiting psych evaluation.      Please note:  Patient was evaluated in Emergency Department today for the symptoms described in the history of present illness. Patient was evaluated in the context of the global COVID-19 pandemic, which necessitated consideration that the patient might be at risk for infection with the SARS-CoV-2 virus that causes COVID-19. Institutional protocols and algorithms that pertain to the evaluation of patients at risk for COVID-19 are in a state of rapid change based on  information released by regulatory bodies including the CDC and federal and state organizations. These policies and algorithms were followed during the patient's care in the ED.  Some ED evaluations and interventions may be delayed as a result of limited staffing during the pandemic.   As part of my medical decision making, I reviewed the following data within the [place] notes reviewed and incorporated, Labs reviewed , Old chart reviewed, A consult was requested and obtained from this/these consultant(s) psychiatry, Notes from prior ED visits and [place] Controlled Substance Database   ____________________________________________   FINAL CLINICAL IMPRESSION(S) / ED DIAGNOSES   Final diagnoses:  Schizophrenia, unspecified type ([place])      NEW MEDICATIONS STARTED DURING THIS VISIT:  ED Discharge Orders    None       Note:  This document was prepared using Dragon voice recognition software and may include unintentional dictation errors.     Alfred Levins, [place], MD 09/02/19 [phone]

## 2019-09-02 NOTE — Consult Note (Signed)
[facility] Psych ED Discharge  09/02/2019 11:33 AM [name]  MRN:  [id number] Principal Problem: Substance induced mood disorder [facility]) Discharge Diagnoses: Principal Problem:   Substance induced mood disorder ([place]) Active Problems:   Severe bipolar affective disorder with psychosis ([place])   Methamphetamine abuse ([place])  Subjective: "I'm so-so."  Denies suicidal/homicidal ideations, hallucinations, and withdrawal symptoms.  Patient seen and evaluated in person by this provider.  He came to the ED last night after using meth and experiencing paranoia.  Today he is clear and coherent with no paranoia or other psychiatric concerns.  He was admitted and discharged from [place] [facility] in October.  He was to follow-up with [facility] but shows instead to go to [facility].  He reports having difficulty affording his medications and only taking half the amount.  Encouraged to go to [facility] but he refused.  Recommend substance abuse treatment the patient declines.  He is psychiatrically clear at this time.  HPI per NP on admission:  Harold Brooks, 54 y.o., male patient seen face to face by this provider; chart reviewed and consulted with Dr. Alfred Levins on 09/02/19.  On evaluation Harold Brooks reports that someone is out to kill him. At this time patient states that he does not have a psychiatrist nor does he have a therapist due to lack of insurance.  Patient is well known to this ER as he frequents it often, particularly after a night of illegal drug use such as crack and or meth.  Patient wad here Aug 13, 2019 with a similar presentation and was discharged after he was given an injection in the er to calm down.  During evaluation Harold Brooks is lying in bed; he is alert/oriented x 4; delusional/combative; and mood congruent with affect.  Patient was yelling upon entry into the er screeming "please dont kill me".  Police reported that patient addressed him as his drug dealer and  begged him not to kill him because he stole the "dope". Patient thought police was a his dealer coming to collect.  Patient has extreme paranoia and evn motioned his body as if he was being stabbed. Patient's mom is his support but at this time she is at a loss as to what to do as patients behavior are becoming more extreme and unmanageable when he uses drugs (meth and crack).    Patient was given inapsine 5mg  at injection at 2353 and is now in a more cooperative state of being. He denies suicidal/self-harm/homicidal ideation,he is still paranoid and believes someone is out to kill him.  Total Time spent with patient: 30 minutes  Past Psychiatric History: bipolar d/o, substance use d/o  Past Medical History:  Past Medical History:  Diagnosis Date  . Kidney stones   . Schizophrenia ([place])    History reviewed. No pertinent surgical history. Family History: No family history on file. Family Psychiatric  History: none Social History:  Social History   Substance and Sexual Activity  Alcohol Use No     Social History   Substance and Sexual Activity  Drug Use Yes  . Types: Cocaine, Marijuana, Benzodiazepines   Comment: patient reported to BP he is using meth now, not crack    Social History   Socioeconomic History  . Marital status: Unknown    Spouse name: Not on file  . Number of children: Not on file  . Years of education: Not on file  . Highest education level: Not on file  Occupational  History  . Not on file  Tobacco Use  . Smoking status: Current Every Day Smoker    Types: Cigarettes  Substance and Sexual Activity  . Alcohol use: No  . Drug use: Yes    Types: Cocaine, Marijuana, Benzodiazepines    Comment: patient reported to BP he is using meth now, not crack  . Sexual activity: Yes    Birth control/protection: None  Other Topics Concern  . Not on file  Social History Narrative  . Not on file   Social Determinants of Health   Financial Resource Strain:   .  Difficulty of Paying Living Expenses: Not on file  Food Insecurity:   . Worried About Programme researcher, broadcasting/film/video in the Last Year: Not on file  . Ran Out of Food in the Last Year: Not on file  Transportation Needs:   . Lack of Transportation (Medical): Not on file  . Lack of Transportation (Non-Medical): Not on file  Physical Activity:   . Days of Exercise per Week: Not on file  . Minutes of Exercise per Session: Not on file  Stress:   . Feeling of Stress : Not on file  Social Connections:   . Frequency of Communication with Friends and Family: Not on file  . Frequency of Social Gatherings with Friends and Family: Not on file  . Attends Religious Services: Not on file  . Active Member of Clubs or Organizations: Not on file  . Attends Banker Meetings: Not on file  . Marital Status: Not on file    Has this patient used any form of tobacco in the last 30 days? (Cigarettes, Smokeless Tobacco, Cigars, and/or Pipes) A prescription for an FDA-approved tobacco cessation medication was offered at discharge and the patient refused  Current Medications: Current Facility-Administered Medications  Medication Dose Route Frequency Provider Last Rate Last Admin  . chlorproMAZINE (THORAZINE) tablet 200 mg  200 mg Oral BID Charm Rings, NP      . gabapentin (NEURONTIN) capsule 300 mg  300 mg Oral TID Charm Rings, NP      . OLANZapine (ZYPREXA) tablet 5 mg  5 mg Oral BID Charm Rings, NP      . pantoprazole (PROTONIX) EC tablet 40 mg  40 mg Oral Daily Charm Rings, NP       Current Outpatient Medications  Medication Sig Dispense Refill  . chlorproMAZINE (THORAZINE) 200 MG tablet Take 1 tablet (200 mg total) by mouth 2 (two) times daily. For mood control 60 tablet 0  . clonazePAM (KLONOPIN) 2 MG tablet Take 1 tablet (2 mg total) by mouth 3 (three) times daily. For severe anxiety symptoms 30 tablet 0  . gabapentin (NEURONTIN) 300 MG capsule Take 1 capsule (300 mg total) by mouth 3  (three) times daily. For agitation 90 capsule 0  . OLANZapine (ZYPREXA) 5 MG tablet Take 1 tablet (5 mg total) by mouth 2 (two) times daily at 10 AM and 5 PM. 30 tablet 2  . pantoprazole (PROTONIX) 40 MG tablet Take 1 tablet (40 mg total) by mouth daily. For acid reflux 30 tablet 0  . QUEtiapine (SEROQUEL) 300 MG tablet Take 1 tablet (300 mg total) by mouth at bedtime. For mood control 30 tablet 0   PTA Medications: (Not in a hospital admission)   Musculoskeletal: Strength & Muscle Tone: within normal limits Gait & Station: normal Patient leans: N/A  Psychiatric Specialty Exam: Physical Exam Vitals and nursing note reviewed.  Constitutional:  Appearance: Normal appearance.  HENT:     Head: Normocephalic.     Nose: Nose normal.  Pulmonary:     Effort: Pulmonary effort is normal.  Musculoskeletal:        General: Normal range of motion.     Cervical back: Normal range of motion.  Neurological:     General: No focal deficit present.     Mental Status: He is alert and oriented to person, place, and time.  Psychiatric:        Attention and Perception: Attention and perception normal.        Mood and Affect: Affect normal. Mood is anxious.        Speech: Speech normal.        Behavior: Behavior normal. Behavior is cooperative.        Thought Content: Thought content normal.        Cognition and Memory: Cognition normal.        Judgment: Judgment normal.     Review of Systems  Psychiatric/Behavioral: The patient is nervous/anxious.   All other systems reviewed and are negative.   Blood pressure (!) 153/107, pulse (!) 118, temperature 98.9 F (37.2 C), resp. rate 20, weight 90.7 kg, SpO2 97 %.Body mass index is 26.38 kg/m.  General Appearance: Casual  Eye Contact:  Good  Speech:  Normal Rate  Volume:  Normal  Mood:  Anxious and Irritable  Affect:  Congruent  Thought Process:  Coherent and Descriptions of Associations: Intact  Orientation:  Full (Time, Place, and  Person)  Thought Content:  WDL and Logical  Suicidal Thoughts:  No  Homicidal Thoughts:  No  Memory:  Immediate;   Fair Recent;   Good Remote;   Good  Judgement:  Fair  Insight:  Fair  Psychomotor Activity:  Normal  Concentration:  Concentration: Good and Attention Span: Good  Recall:  Good  Fund of Knowledge:  Fair  Language:  Good  Akathisia:  No  Handed:  Right  AIMS (if indicated):     Assets:  Leisure Time Physical Health Resilience Social Support  ADL's:  Intact  Cognition:  WNL  Sleep:        Demographic Factors:  Male, Caucasian and Living alone  Loss Factors: NA  Historical Factors: NA  Risk Reduction Factors:   Sense of responsibility to family and Positive social support  Continued Clinical Symptoms:  Anxiety, mild  Cognitive Features That Contribute To Risk:  None    Suicide Risk:  Minimal: No identifiable suicidal ideation.  Patients presenting with no risk factors but with morbid ruminations; may be classified as minimal risk based on the severity of the depressive symptoms   Plan Of Care/Follow-up recommendations:  Stimulant use disorder: -Continue gabapentin 300 mg 3 times daily -Recommend substance abuse treatment, client refuses  Bipolar disorder: -Continue Thorazine 200 mg twice daily -Continue Zyprexa 5 mg twice daily -Continue Seroquel 300 milligrams at bedtime -Follow-up with [facility] for medication assistance, client refuses will continue to go to [facility]  Anxiety: -Continue Klonopin 2 mg 3 times daily as needed anxiety, recommend tapering off this medication. Activity:  as tolerated Diet:  heart healthy diet  Disposition: discharge home Nanine Means, NP 09/02/2019, 11:33 AM

## 2019-09-08 ENCOUNTER — Other Ambulatory Visit: Payer: Self-pay

## 2019-09-08 ENCOUNTER — Encounter: Payer: Self-pay | Admitting: *Deleted

## 2019-09-08 ENCOUNTER — Emergency Department (EMERGENCY_DEPARTMENT_HOSPITAL)
Admission: EM | Admit: 2019-09-08 | Discharge: 2019-09-09 | Disposition: A | Discharge status: Home / Self Care | Payer: Medicaid Other | Source: Home / Self Care | Attending: Emergency Medicine | Admitting: Emergency Medicine

## 2019-09-08 ENCOUNTER — Emergency Department
Admission: EM | Admit: 2019-09-08 | Discharge: 2019-09-08 | Disposition: A | Discharge status: Home / Self Care | Payer: Medicaid Other | Attending: Emergency Medicine | Admitting: Emergency Medicine

## 2019-09-08 ENCOUNTER — Encounter: Payer: Self-pay | Admitting: Emergency Medicine

## 2019-09-08 DIAGNOSIS — Z008 Encounter for other general examination: Secondary | ICD-10-CM | POA: Insufficient documentation

## 2019-09-08 DIAGNOSIS — F3189 Other bipolar disorder: Secondary | ICD-10-CM | POA: Diagnosis not present

## 2019-09-08 DIAGNOSIS — F22 Delusional disorders: Secondary | ICD-10-CM | POA: Insufficient documentation

## 2019-09-08 DIAGNOSIS — Z20822 Contact with and (suspected) exposure to covid-19: Secondary | ICD-10-CM | POA: Diagnosis not present

## 2019-09-08 DIAGNOSIS — F1410 Cocaine abuse, uncomplicated: Secondary | ICD-10-CM | POA: Insufficient documentation

## 2019-09-08 DIAGNOSIS — F1994 Other psychoactive substance use, unspecified with psychoactive substance-induced mood disorder: Secondary | ICD-10-CM | POA: Diagnosis present

## 2019-09-08 DIAGNOSIS — F15959 Other stimulant use, unspecified with stimulant-induced psychotic disorder, unspecified: Secondary | ICD-10-CM | POA: Diagnosis present

## 2019-09-08 DIAGNOSIS — F17210 Nicotine dependence, cigarettes, uncomplicated: Secondary | ICD-10-CM | POA: Insufficient documentation

## 2019-09-08 DIAGNOSIS — F19150 Other psychoactive substance abuse with psychoactive substance-induced psychotic disorder with delusions: Secondary | ICD-10-CM | POA: Insufficient documentation

## 2019-09-08 DIAGNOSIS — F319 Bipolar disorder, unspecified: Secondary | ICD-10-CM | POA: Diagnosis not present

## 2019-09-08 DIAGNOSIS — F312 Bipolar disorder, current episode manic severe with psychotic features: Secondary | ICD-10-CM | POA: Insufficient documentation

## 2019-09-08 DIAGNOSIS — Z79899 Other long term (current) drug therapy: Secondary | ICD-10-CM | POA: Insufficient documentation

## 2019-09-08 DIAGNOSIS — F1910 Other psychoactive substance abuse, uncomplicated: Secondary | ICD-10-CM

## 2019-09-08 DIAGNOSIS — F1210 Cannabis abuse, uncomplicated: Secondary | ICD-10-CM | POA: Insufficient documentation

## 2019-09-08 LAB — RESPIRATORY PANEL BY RT PCR (FLU A&B, COVID)
Influenza A by PCR: NEGATIVE
Influenza B by PCR: NEGATIVE
SARS Coronavirus 2 by RT PCR: NEGATIVE

## 2019-09-08 LAB — BASIC METABOLIC PANEL
Anion gap: 17 — ABNORMAL HIGH (ref 5–15)
BUN: 16 mg/dL (ref 6–20)
CO2: 20 mmol/L — ABNORMAL LOW (ref 22–32)
Calcium: 9.1 mg/dL (ref 8.9–10.3)
Chloride: 101 mmol/L (ref 98–111)
Creatinine, Ser: 1.32 mg/dL — ABNORMAL HIGH (ref 0.61–1.24)
GFR calc Af Amer: >60 mL/min (ref >60)
GFR calc non Af Amer: >60 mL/min (ref >60)
Glucose, Bld: 83 mg/dL (ref 70–99)
Potassium: 3.2 mmol/L — ABNORMAL LOW (ref 3.5–5.1)
Sodium: 138 mmol/L (ref 135–145)

## 2019-09-08 LAB — COMPREHENSIVE METABOLIC PANEL
ALT: 51 U/L — ABNORMAL HIGH (ref 0–44)
AST: 35 U/L (ref 15–41)
Albumin: 4.3 g/dL (ref 3.5–5.0)
Alkaline Phosphatase: 104 U/L (ref 38–126)
Anion gap: 18 — ABNORMAL HIGH (ref 5–15)
BUN: 14 mg/dL (ref 6–20)
CO2: 17 mmol/L — ABNORMAL LOW (ref 22–32)
Calcium: 9.3 mg/dL (ref 8.9–10.3)
Chloride: 103 mmol/L (ref 98–111)
Creatinine, Ser: 1.43 mg/dL — ABNORMAL HIGH (ref 0.61–1.24)
GFR calc Af Amer: >60 mL/min (ref >60)
GFR calc non Af Amer: 56 mL/min — ABNORMAL LOW (ref >60)
Glucose, Bld: 127 mg/dL — ABNORMAL HIGH (ref 70–99)
Potassium: 3.2 mmol/L — ABNORMAL LOW (ref 3.5–5.1)
Sodium: 138 mmol/L (ref 135–145)
Total Bilirubin: 1 mg/dL (ref 0.3–1.2)
Total Protein: 7.6 g/dL (ref 6.5–8.1)

## 2019-09-08 LAB — CBC WITH DIFFERENTIAL/PLATELET
Abs Immature Granulocytes: 0.03 10*3/uL (ref 0.00–0.07)
Basophils Absolute: 0 10*3/uL (ref 0.0–0.1)
Basophils Relative: 1 %
Eosinophils Absolute: 0 10*3/uL (ref 0.0–0.5)
Eosinophils Relative: 0 %
HCT: 43.5 % (ref 39.0–52.0)
Hemoglobin: 15.2 g/dL (ref 13.0–17.0)
Immature Granulocytes: 0 %
Lymphocytes Relative: 19 %
Lymphs Abs: 1.5 10*3/uL (ref 0.7–4.0)
MCH: 31.5 pg (ref 26.0–34.0)
MCHC: 34.9 g/dL (ref 30.0–36.0)
MCV: 90.2 fL (ref 80.0–100.0)
Monocytes Absolute: 0.7 10*3/uL (ref 0.1–1.0)
Monocytes Relative: 9 %
Neutro Abs: 5.7 10*3/uL (ref 1.7–7.7)
Neutrophils Relative %: 71 %
Platelets: 379 10*3/uL (ref 150–400)
RBC: 4.82 MIL/uL (ref 4.22–5.81)
RDW: 12.3 % (ref 11.5–15.5)
WBC: 8 10*3/uL (ref 4.0–10.5)
nRBC: 0 % (ref 0.0–0.2)

## 2019-09-08 LAB — CBC
HCT: 42.6 % (ref 39.0–52.0)
Hemoglobin: 15 g/dL (ref 13.0–17.0)
MCH: 31.5 pg (ref 26.0–34.0)
MCHC: 35.2 g/dL (ref 30.0–36.0)
MCV: 89.5 fL (ref 80.0–100.0)
Platelets: 387 10*3/uL (ref 150–400)
RBC: 4.76 MIL/uL (ref 4.22–5.81)
RDW: 12.1 % (ref 11.5–15.5)
WBC: 8.8 10*3/uL (ref 4.0–10.5)
nRBC: 0 % (ref 0.0–0.2)

## 2019-09-08 LAB — ETHANOL
Alcohol, Ethyl (B): <10 mg/dL (ref <10)
Alcohol, Ethyl (B): <10 mg/dL (ref <10)

## 2019-09-08 LAB — ACETAMINOPHEN LEVEL: Acetaminophen (Tylenol), Serum: <10 ug/mL — ABNORMAL LOW (ref 10–30)

## 2019-09-08 LAB — SALICYLATE LEVEL: Salicylate Lvl: <7 mg/dL — ABNORMAL LOW (ref 7.0–30.0)

## 2019-09-08 MED ORDER — DROPERIDOL 2.5 MG/ML IJ SOLN
5.0000 mg | Freq: Once | INTRAMUSCULAR | Status: AC
  Administered 2019-09-08: 21:00:00 5 mg via INTRAMUSCULAR
  Filled 2019-09-08: qty 2

## 2019-09-08 NOTE — Consult Note (Signed)
[facility] Face-to-Face Psychiatry Consult   Reason for Consult:  Psych evaluation Referring Physician:  Dr. Charna Archer Patient Identification: Harold Brooks MRN:  [id number] Principal Diagnosis: <principal problem not specified> Diagnosis:  Active Problems:   Severe bipolar affective disorder with psychosis ([place])   Total Time spent with patient: 1 hour  Subjective:  Per (er-nurse) Harold Brooks is a 54 y.o. male patient admitted IVC'd because he believes "everyone is trying to kill him"  HPI:  Harold Brooks,53 y.o.,malepatient seen face to faceby this provider; chart reviewed and consulted with Dr. Shelbie Ammons on01/09/21. On evaluation Harold Spengler Birchettreports that someone is out to kill him. At this time patient states that he does not have a psychiatrist nor does he have a therapist due to lack of insurance. Patient is well known to this ER as he frequents it often, particularly after a night of illegal drug use such as crack and or meth. Patient was discharged earlier from the er this afternoon, 2 with a similar presentation.    Patient is back in the ER due to being IVCd because he was observed standing ton the porch stating that someone was trying to kill him. He is currently making these claims in the ER and is difficult to redirect.    During evaluationHayward D Birchettis standing in the doorway with an officer; he is alert but /disoriented. He is having paronoiddelusions and he is combative his mood congruent with affect. Patient was continuously making statements that someone is trying to kill him and telling security and staff "please dont kill me".   Patientwas given inapsine 2.5mg  at injection at approximately 2113.  Typically patient calms down and is able to be discharged after a night of observation.   Recommendation: Overnight observation and discharge in the am is psychiatrically stable.  Past Psychiatric History: Yes   Risk to Self: Suicidal Ideation:  No Suicidal Intent: No Is patient at risk for suicide?: No Suicidal Plan?: No Access to Means: No What has been your use of drugs/alcohol within the last 12 months?: Methamphetamine and Cocaine How many times?: 0 Other Self Harm Risks: Active Drug Use Triggers for Past Attempts: None known Intentional Self Injurious Behavior: None Risk to Others: Homicidal Ideation: No Thoughts of Harm to Others: No Current Homicidal Intent: No Current Homicidal Plan: No Access to Homicidal Means: No History of harm to others?: No Assessment of Violence: None Noted Violent Behavior Description: None reported Does patient have access to weapons?: No Criminal Charges Pending?: No Does patient have a court date: No Prior Inpatient Therapy: Prior Inpatient Therapy: Yes Prior Therapy Dates: 08/2013, 12/2012, 09/2009, 09/2008 & 08/2008 Prior Therapy Facilty/Provider(s): [facility], [facility] Reason for Treatment: Substance Use Disorder and Depression Prior Outpatient Therapy: Prior Outpatient Therapy: Yes Prior Therapy Dates: 2015 Prior Therapy Facilty/Provider(s): [facility] Reason for Treatment: Substance Abuse and Depression Does patient have an ACCT team?: No Does patient have Intensive In-House Services?  : No Does patient have [facility] services? : No Does patient have P4CC services?: No  Past Medical History:  Past Medical History:  Diagnosis Date  . Kidney stones   . Schizophrenia ([place])    History reviewed. No pertinent surgical history. Family History: No family history on file. Family Psychiatric  History: unknown Social History:  Social History   Substance and Sexual Activity  Alcohol Use No     Social History   Substance and Sexual Activity  Drug Use Yes  . Types:  Cocaine, Marijuana, Benzodiazepines, Methamphetamines   Comment: patient reported to BP he is using meth now, not crack    Social History   Socioeconomic History  . Marital status:  Unknown    Spouse name: Not on file  . Number of children: Not on file  . Years of education: Not on file  . Highest education level: Not on file  Occupational History  . Not on file  Tobacco Use  . Smoking status: Current Every Day Smoker    Types: Cigarettes  . Smokeless tobacco: Never Used  Substance and Sexual Activity  . Alcohol use: No  . Drug use: Yes    Types: Cocaine, Marijuana, Benzodiazepines, Methamphetamines    Comment: patient reported to BP he is using meth now, not crack  . Sexual activity: Yes    Birth control/protection: None  Other Topics Concern  . Not on file  Social History Narrative  . Not on file   Social Determinants of Health   Financial Resource Strain:   . Difficulty of Paying Living Expenses: Not on file  Food Insecurity:   . Worried About Programme researcher, broadcasting/film/video in the Last Year: Not on file  . Ran Out of Food in the Last Year: Not on file  Transportation Needs:   . Lack of Transportation (Medical): Not on file  . Lack of Transportation (Non-Medical): Not on file  Physical Activity:   . Days of Exercise per Week: Not on file  . Minutes of Exercise per Session: Not on file  Stress:   . Feeling of Stress : Not on file  Social Connections:   . Frequency of Communication with Friends and Family: Not on file  . Frequency of Social Gatherings with Friends and Family: Not on file  . Attends Religious Services: Not on file  . Active Member of Clubs or Organizations: Not on file  . Attends Banker Meetings: Not on file  . Marital Status: Not on file   Additional Social History:    Allergies:  No Known Allergies  Labs:  Results for orders placed or performed during the hospital encounter of 09/08/19 (from the past 48 hour(s))  Basic metabolic panel     Status: Abnormal   Collection Time: 09/08/19  6:38 PM  Result Value Ref Range   Sodium 138 135 - 145 mmol/L   Potassium 3.2 (L) 3.5 - 5.1 mmol/L   Chloride 101 98 - 111 mmol/L    CO2 20 (L) 22 - 32 mmol/L   Glucose, Bld 83 70 - 99 mg/dL   BUN 16 6 - 20 mg/dL   Creatinine, Ser 3.82 (H) 0.61 - 1.24 mg/dL   Calcium 9.1 8.9 - 50.5 mg/dL   GFR calc non Af Amer >60 >60 mL/min   GFR calc Af Amer >60 >60 mL/min   Anion gap 17 (H) 5 - 15    Comment: Performed at [facility], [address] [place], [place] [address]  CBC with Differential     Status: None   Collection Time: 09/08/19  6:38 PM  Result Value Ref Range   WBC 8.0 4.0 - 10.5 K/uL   RBC 4.82 4.22 - 5.81 MIL/uL   Hemoglobin 15.2 13.0 - 17.0 g/dL   HCT 34.1 93.7 - 90.2 %   MCV 90.2 80.0 - 100.0 fL   MCH 31.5 26.0 - 34.0 pg   MCHC 34.9 30.0 - 36.0 g/dL   RDW 40.9 73.5 - 32.9 %   Platelets 379  150 - 400 K/uL   nRBC 0.0 0.0 - 0.2 %   Neutrophils Relative % 71 %   Neutro Abs 5.7 1.7 - 7.7 K/uL   Lymphocytes Relative 19 %   Lymphs Abs 1.5 0.7 - 4.0 K/uL   Monocytes Relative 9 %   Monocytes Absolute 0.7 0.1 - 1.0 K/uL   Eosinophils Relative 0 %   Eosinophils Absolute 0.0 0.0 - 0.5 K/uL   Basophils Relative 1 %   Basophils Absolute 0.0 0.0 - 0.1 K/uL   Immature Granulocytes 0 %   Abs Immature Granulocytes 0.03 0.00 - 0.07 K/uL    Comment: Performed at [facility], [address] [place], [place] [address]  Ethanol     Status: None   Collection Time: 09/08/19  6:38 PM  Result Value Ref Range   Alcohol, Ethyl (B) <10 <10 mg/dL    Comment: (NOTE) Lowest detectable limit for serum alcohol is 10 mg/dL. For medical purposes only. Performed at [facility], [address] [place], [place] [address]     No current facility-administered medications for this encounter.   Current Outpatient Medications  Medication Sig Dispense Refill  . chlorproMAZINE (THORAZINE) 200 MG tablet Take 1 tablet (200 mg total) by mouth 2 (two) times daily. For mood control 60 tablet 0  . OLANZapine (ZYPREXA) 5 MG tablet Take 1 tablet (5 mg total) by mouth 2 (two) times daily at 10 AM and 5 PM. 30  tablet 2  . pantoprazole (PROTONIX) 40 MG tablet Take 1 tablet (40 mg total) by mouth daily. For acid reflux 30 tablet 0  . QUEtiapine (SEROQUEL) 300 MG tablet Take 1 tablet (300 mg total) by mouth at bedtime. For mood control 30 tablet 0    Musculoskeletal: Strength & Muscle Tone: within normal limits Gait & Station: normal Patient leans: N/A  Psychiatric Specialty Exam: Physical Exam  Review of Systems  Blood pressure (!) 143/68, pulse (!) 113, temperature 97.6 F (36.4 C), temperature source Oral, resp. rate 18, height 6\' 1"  (1.854 m), weight 95.3 kg, SpO2 97 %.Body mass index is 27.71 kg/m.  General Appearance: Bizarre  Eye Contact:  Minimal  Speech:  Garbled  Volume:  Increased  Mood:  Anxious, Irritable and Paranoid  Affect:  Inappropriate and Labile  Thought Process:  Disorganized and Descriptions of Associations: Loose  Orientation:  NA  Thought Content:  Delusions and Hallucinations: Visual  Suicidal Thoughts:  No  Homicidal Thoughts:  No  Memory:  Immediate;   Poor  Judgement:  Poor  Insight:  Lacking  Psychomotor Activity:  Increased  Concentration:  Concentration: Fair  Recall:  of Knowledge:  Fair  Language:  Fair  Akathisia:  NA  Handed:  Right  AIMS (if indicated):     Assets:  Transportation  ADL's:  Impaired  Cognition:  Impaired,  Moderate  Sleep:        Treatment Plan Summary: Plan Observe overnite and reassess in the am for discharge  Disposition: Reassess in the am...DC if psychiatrically stable  [employer], NP 09/08/2019 9:41 PM

## 2019-09-08 NOTE — ED Notes (Signed)
Psych at bedside.

## 2019-09-08 NOTE — BH Assessment (Signed)
Writer spoke with patient to complete updated assessment. He denies SI/HI and AV/H. He states he has a follow up appointment with [facility] for 09/13/2019.

## 2019-09-08 NOTE — Discharge Instructions (Signed)
[facility] (Mental Health & Substance Use Services) & [facility]  Mental health service in [place] Address: [address], [place], [place] [address] Hours:  Closed ? Opens 8AM Mon Phone: [phone] 

## 2019-09-08 NOTE — ED Triage Notes (Signed)
Arrives IVC after standing on porch and making paranoid statements as stated on IVC paperwork. Patient has flight of ideas and delusional speech but can answer questions.

## 2019-09-08 NOTE — ED Notes (Signed)
Pt given phone to talk to son. Pt states he needs officers to take him home.

## 2019-09-08 NOTE — ED Notes (Signed)
Pt sitting on bed saying "please jackie, you know I love you" over and over and motioning towards the door. This RN goes over and asks pt to keep his voice down and pt states "you can't tell me what to do". Pt quiets his voice but continues stating the same thing over and over.
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
## 2019-09-08 NOTE — BH Assessment (Signed)
Assessment Note  Harold Brooks is an 54 y.o. male presenting to [facility] ED under IVC after recently being discharged from this ED this morning today. Once patient left the ED he returned home and was placed under IVC after standing on his porch and making paranoid remarks. Patient was reporting persecutory delusions that people are out to get him and want to hurt and kill him. During the assessment patient was found standing in the doorway of his room and refusing to sit on his bed. When Psyc NP and TTS arrived patient believed that staff where there to kill him. ED police had to try to redirect and assure him of his safety but patient was difficult to redirect. Patient reported to psychiatric team that he went home after leaving the ED to get some rest, when asked if patient had been using substances today patient denied any use today. Patient continued to have flight of ideas and persecutory delusions "everybody is trying to kill me, I'll kill myself I know they are going to soon." Patient has a history of Methamphetamine and Cocaine use and admits to continued use but denies use tonight. Patient reports not getting a lot of sleep "I only sleep a couple of hours."  Patient currently denies SI but continues to make statements to ED staff to "give me a gun I will shoot myself."   Per Psyc NP patient will be observed overnight and discharged in the morning when psychiatrically stable.   Diagnosis: Substance Induced Psychosis  Past Medical History:  Past Medical History:  Diagnosis Date  . Kidney stones   . Schizophrenia ([facility])     History reviewed. No pertinent surgical history.  Family History: No family history on file.  Social History:  reports that he has been smoking cigarettes. He has never used smokeless tobacco. He reports current drug use. Drugs: Cocaine, Marijuana, Benzodiazepines, and Methamphetamines. He reports that he does not drink alcohol.  Additional Social History:  Alcohol /  Drug Use Pain Medications: See MAR Prescriptions: See MAR Over the Counter: See MAR History of alcohol / drug use?: Yes Substance #1 Name of Substance 1: Methamphetamine Substance #2 Name of Substance 2: Cocaine  CIWA: CIWA-Ar BP: (!) 143/68 Pulse Rate: (!) 113 COWS:    Allergies: No Known Allergies  Home Medications: (Not in a hospital admission)   OB/GYN Status:  No LMP for male patient.  General Assessment Data Location of Assessment: [facility] ED TTS Assessment: In system Is this a Tele or Face-to-Face Assessment?: Face-to-Face Is this an Initial Assessment or a Re-assessment for this encounter?: Initial Assessment Patient Accompanied by:: N/A Language Other than English: No Living Arrangements: Other (Comment)(Private Residence) What gender do you identify as?: Male Marital status: Single Living Arrangements: Alone Can pt return to current living arrangement?: Yes Admission Status: Involuntary Petitioner: Police Is patient capable of signing voluntary admission?: No Referral Source: Other Insurance type: None  Medical Screening Exam [facility] Walk-in ONLY) Medical Exam completed: Yes  Crisis Care Plan Living Arrangements: Alone Legal Guardian: Other:(Self) Name of Psychiatrist: Dr. Suzanna Obey Care Name of Therapist: Reports of none  Education Status Is patient currently in school?: No Is the patient employed, unemployed or receiving disability?: Unemployed  Risk to self with the past 6 months Suicidal Ideation: No Has patient been a risk to self within the past 6 months prior to admission? : Yes Suicidal Intent: No Has patient had any suicidal intent within the past 6 months prior to admission? : No Is  patient at risk for suicide?: No Suicidal Plan?: No Has patient had any suicidal plan within the past 6 months prior to admission? : No Access to Means: No What has been your use of drugs/alcohol within the last 12 months?: Methamphetamine and  Cocaine Previous Attempts/Gestures: No How many times?: 0 Other Self Harm Risks: Active Drug Use Triggers for Past Attempts: None known Intentional Self Injurious Behavior: None Family Suicide History: No Recent stressful life event(s): Financial Problems, Other (Comment)(Lack of insurance) Persecutory voices/beliefs?: Yes Depression: Yes Depression Symptoms: Isolating, Feeling worthless/self pity Substance abuse history and/or treatment for substance abuse?: Yes Suicide prevention information given to non-admitted patients: Not applicable  Risk to Others within the past 6 months Homicidal Ideation: No Does patient have any lifetime risk of violence toward others beyond the six months prior to admission? : No Thoughts of Harm to Others: No Current Homicidal Intent: No Current Homicidal Plan: No Access to Homicidal Means: No History of harm to others?: No Assessment of Violence: None Noted Violent Behavior Description: None reported Does patient have access to weapons?: No Criminal Charges Pending?: No Does patient have a court date: No Is patient on probation?: No  Psychosis Hallucinations: Auditory Delusions: Persecutory  Mental Status Report Appearance/Hygiene: In scrubs Eye Contact: Good Motor Activity: Freedom of movement, Agitation, Restlessness Speech: Logical/coherent Level of Consciousness: Alert, Irritable Mood: Anxious, Suspicious, Apprehensive, Terrified Affect: Anxious, Apprehensive, Fearful, Frightened Anxiety Level: Severe Thought Processes: Flight of Ideas, Irrelevant Judgement: Impaired Orientation: Person Obsessive Compulsive Thoughts/Behaviors: Moderate  Cognitive Functioning Concentration: Poor Memory: Recent Impaired, Remote Impaired Is patient IDD: No Insight: Poor Impulse Control: Poor Appetite: Poor Have you had any weight changes? : No Change Sleep: Decreased Total Hours of Sleep: 3 Vegetative Symptoms: None  ADLScreening [facility]  Assessment Services) Patient's cognitive ability adequate to safely complete daily activities?: Yes Patient able to express need for assistance with ADLs?: Yes Independently performs ADLs?: Yes (appropriate for developmental age)  Prior Inpatient Therapy Prior Inpatient Therapy: Yes Prior Therapy Dates: 08/2013, 12/2012, 09/2009, 09/2008 & 08/2008 Prior Therapy Facilty/Provider(s): [facility], [facility] Reason for Treatment: Substance Use Disorder and Depression  Prior Outpatient Therapy Prior Outpatient Therapy: Yes Prior Therapy Dates: 2015 Prior Therapy Facilty/Provider(s): [facility] Reason for Treatment: Substance Abuse and Depression Does patient have an ACCT team?: No Does patient have Intensive In-House Services?  : No Does patient have [facility] services? : No Does patient have P4CC services?: No  ADL Screening (condition at time of admission) Patient's cognitive ability adequate to safely complete daily activities?: Yes Is the patient deaf or have difficulty hearing?: No Does the patient have difficulty seeing, even when wearing glasses/contacts?: No Does the patient have difficulty concentrating, remembering, or making decisions?: No Patient able to express need for assistance with ADLs?: Yes Does the patient have difficulty dressing or bathing?: No Independently performs ADLs?: Yes (appropriate for developmental age) Does the patient have difficulty walking or climbing stairs?: No Weakness of Legs: None Weakness of Arms/Hands: None  Home Assistive Devices/Equipment Home Assistive Devices/Equipment: None  Therapy Consults (therapy consults require a physician order) PT Evaluation Needed: No OT Evalulation Needed: No SLP Evaluation Needed: No Abuse/Neglect Assessment (Assessment to be complete while patient is alone) Abuse/Neglect Assessment Can Be Completed: Yes Physical Abuse: Denies Verbal Abuse: Denies Sexual Abuse:  Denies Exploitation of patient/patient's resources: Denies Self-Neglect: Denies Values / Beliefs Cultural Requests During Hospitalization: None Spiritual Requests During Hospitalization: None Consults Spiritual Care Consult Needed: No Transition of  Care Team Consult Needed: No Advance Directives (For Healthcare) Does Patient Have a Medical Advance Directive?: No          Disposition:  Per Psyc NP patient will be observed overnight and discharged in the morning when psychiatrically stable.  Disposition Initial Assessment Completed for this Encounter: Yes  On Site Evaluation by:   Reviewed with Physician:    Benay Pike MS LCAS-A 09/08/2019 9:29 PM
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
## 2019-09-08 NOTE — ED Notes (Signed)
Son here to pick patient up. Pt signed paper copy and handed d/c paperwork.

## 2019-09-08 NOTE — ED Notes (Signed)
Called mother about picking patient up and she began yelling stating "why can't they commit him to the crazy ward?" This RN explained that he has been cleared by psychiatry and mother began yelling again "Because they don't want to put up with his crazy ass I get it." Mother also asked about officers bringing him home and this RN stated that only security officers are working right now and they cannot take him home. Mother then states "I'll be there whenever the hell I get there goodbye" and hung up.

## 2019-09-08 NOTE — ED Provider Notes (Signed)
[facility] Emergency Department Provider Note   ____________________________________________   First MD Initiated Contact with Patient 09/08/19 1911     (approximate)  I have reviewed the triage vital signs and the nursing notes.   HISTORY  Chief Complaint IVC   HPI Harold Brooks is a 54 y.o. male with possible history of schizophrenia and substance abuse presents to the ED for paranoia.  Per [employer], patient was found standing on a porch and making multiple paranoid statements as well as threats to harm himself.  He was subsequently placed under IVC and brought to the ED for further evaluation.  Here in the ED, patient continually making statements that "you all are fixin to hurt me" and that he wants to end his life.  He is not redirectable and does not answer questions when asked if he has been drinking or using drugs.        Past Medical History:  Diagnosis Date  . Kidney stones   . Schizophrenia [facility])     Patient Active Problem List   Diagnosis Date Noted  . 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 Substance induced mood disorder ([place]) 12/05/2014  . Severe bipolar affective disorder with psychosis ([place]) 12/05/2014  . 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 PTSD (post-traumatic stress disorder) 01/13/2013  . Cocaine abuse ([place]) 01/13/2013    History reviewed. No pertinent surgical history.  Prior to Admission medications   Medication Sig Start Date End Date Taking? Authorizing Provider  chlorproMAZINE (THORAZINE) 200 MG tablet Take 1 tablet (200 mg total) by mouth 2 (two) times daily. For mood control 12/09/14   Nwoko, Herbert Pun I, NP  OLANZapine (ZYPREXA) 5 MG tablet Take 1 tablet (5 mg total) by mouth 2 (two) times daily at 10 AM and 5 PM. 08/13/19 08/12/20  Nena Polio, MD  pantoprazole (PROTONIX) 40 MG tablet Take 1 tablet (40 mg total) by mouth daily. For acid reflux 12/09/14   Lindell Spar I, NP  QUEtiapine (SEROQUEL) 300 MG  tablet Take 1 tablet (300 mg total) by mouth at bedtime. For mood control 12/09/14   Encarnacion Slates, NP    Allergies Patient has no known allergies.  No family history on file.  Social History Social History   Tobacco Use  . Smoking status: Current Every Day Smoker    Types: Cigarettes  . Smokeless tobacco: Never Used  Substance Use Topics  . Alcohol use: No  . Drug use: Yes    Types: Cocaine, Marijuana, Benzodiazepines, Methamphetamines    Comment: patient reported to BP he is using meth now, not crack    Review of Systems Unable to obtain secondary to paranoia and psychosis ____________________________________________   PHYSICAL EXAM:  VITAL SIGNS: ED Triage Vitals  Enc Vitals Group     BP 09/08/19 1829 (!) 143/68     Pulse Rate 09/08/19 1829 (!) 113     Resp 09/08/19 1829 18     Temp 09/08/19 1829 97.6 F (36.4 C)     Temp Source 09/08/19 1829 Oral     SpO2 09/08/19 1829 97 %     Weight 09/08/19 1842 210 lb (95.3 kg)     Height 09/08/19 1842 6\' 1"  (1.854 m)     Head Circumference --      Peak Flow --      Pain Score 09/08/19 1842 0     Pain Loc --      Pain Edu? --  Excl. in GC? --     Constitutional: Alert and oriented. Eyes: Conjunctivae are normal. Head: Atraumatic. Nose: No congestion/rhinnorhea. Mouth/Throat: Mucous membranes are moist. Neck: Normal ROM Cardiovascular: Normal rate, regular rhythm. Grossly normal heart sounds. Respiratory: Normal respiratory effort.  No retractions. Lungs CTAB. Gastrointestinal: Soft and nontender. No distention. Genitourinary: deferred Musculoskeletal: No lower extremity tenderness nor edema. Neurologic: No gross focal neurologic deficits are appreciated. Skin:  Skin is warm, dry and intact. No rash noted. Psychiatric: Pressured speech and labile affect.  ____________________________________________   LABS (all labs ordered are listed, but only abnormal results are displayed)  Labs Reviewed  BASIC  METABOLIC PANEL - Abnormal; Notable for the following components:      Result Value   Potassium 3.2 (*)    CO2 20 (*)    Creatinine, Ser 1.32 (*)    Anion gap 17 (*)    All other components within normal limits  SARS CORONAVIRUS 2 (TAT 6-24 HRS)  CBC WITH DIFFERENTIAL/PLATELET  ETHANOL  URINE DRUG SCREEN, QUALITATIVE ([facility] ONLY)     PROCEDURES  Procedure(s) performed (including Critical Care):  Procedures   ____________________________________________   INITIAL IMPRESSION / ASSESSMENT AND PLAN / ED COURSE       54 year old male with history of schizophrenia and substance abuse presents to the ED under IVC for paranoid statements as well as statements of wanting to end his own life.  He appears paranoid here in the ED, constantly standing at his doorway making paranoid and suicidal statements, is not redirectable.  We will need to medicate him for now as his agitation is gradually increasing, will give dose of droperidol.  Lab work reviewed from earlier today and was unremarkable.  Plan to consult psychiatry and TTS.  Patient evaluated by psychiatry and he appears much calmer now following dose of droperidol.  There is some question of whether his psychosis is secondary to substance abuse.  Psychiatry plans to observe overnight and reevaluate in the morning.      ____________________________________________   FINAL CLINICAL IMPRESSION(S) / ED DIAGNOSES  Final diagnoses:  Paranoia (HCC)  Substance abuse [facility])     ED Discharge Orders    None       Note:  This document was prepared using Dragon voice recognition software and may include unintentional dictation errors.   Chesley Noon, MD 09/09/19 0030

## 2019-09-08 NOTE — ED Notes (Signed)
Pt agitated and ready to leave. Hollering at the door. Pt walked out with officer to son.

## 2019-09-08 NOTE — ED Notes (Signed)
Hourly rounding reveals patient in room. No complaints, stable, in no acute distress. Q15 minute rounds and monitoring via Rover and Officer to continue.   

## 2019-09-08 NOTE — ED Notes (Signed)
Patient is by the door talking non stop. Staff continues to redirect him back to his room. He refused to go inside his room. He said "don't get close to me I will hurst myself". Per NA patient said " give me a gun I will shoot myself". Notified EDP
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
## 2019-09-08 NOTE — ED Triage Notes (Signed)
Pt is only taking his rx'd meds for schizophrenia because he cannot afford the meds.

## 2019-09-08 NOTE — ED Notes (Signed)
Dr. Larinda Buttery came to pt room. Pt refused to sit on bed stating "they are gonna kill me." Dr  proceeded to listen to pt heart and lungs while standing in doorway. Pt stated "I just need some haldol."

## 2019-09-08 NOTE — ED Notes (Signed)
Report to include Situation, Background, Assessment, and Recommendations received from [facility]. Patient alert and oriented, warm and dry, in no acute distress. Patient denies SI, HI, AVH and pain. Patient said "people are following me. Trying to hurt me". He is paranoid. Patient made aware of Q15 minute rounds and Psychologist, counselling presence for their safety. Patient instructed to come to me with needs or concerns.

## 2019-09-08 NOTE — ED Notes (Signed)
Pt standing up and motioning towards the door as if someone if there talking to him. Pt motioning for someone to come to him. Pt otherwise is making sense. When asked to sit down, pt states "I am stretching my legs what's wrong with that". Pt not trying to leave but rather walking around the hall and down the hall. Directable to stand back beside bed. Given a water. Not being violent at this time.
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
## 2019-09-09 DIAGNOSIS — F15959 Other stimulant use, unspecified with stimulant-induced psychotic disorder, unspecified: Secondary | ICD-10-CM

## 2019-09-09 DIAGNOSIS — F3189 Other bipolar disorder: Secondary | ICD-10-CM

## 2019-09-09 DIAGNOSIS — F22 Delusional disorders: Secondary | ICD-10-CM

## 2019-09-09 LAB — URINE DRUG SCREEN, QUALITATIVE (ARMC ONLY)
Amphetamines, Ur Screen: POSITIVE — AB
Barbiturates, Ur Screen: NOT DETECTED
Benzodiazepine, Ur Scrn: NOT DETECTED
Cannabinoid 50 Ng, Ur ~~LOC~~: POSITIVE — AB
Cocaine Metabolite,Ur ~~LOC~~: POSITIVE — AB
MDMA (Ecstasy)Ur Screen: NOT DETECTED
Methadone Scn, Ur: NOT DETECTED
Opiate, Ur Screen: NOT DETECTED
Phencyclidine (PCP) Ur S: NOT DETECTED
Tricyclic, Ur Screen: NOT DETECTED

## 2019-09-09 MED ORDER — HALOPERIDOL DECANOATE 100 MG/ML IM SOLN
100.0000 mg | Freq: Once | INTRAMUSCULAR | Status: AC
  Administered 2019-09-09: 100 mg via INTRAMUSCULAR
  Filled 2019-09-09: qty 1

## 2019-09-09 MED ORDER — HALOPERIDOL 5 MG PO TABS: 5.0000 mg | ORAL_TABLET | Freq: Two times a day (BID) | ORAL | 0 refills | Status: AC

## 2019-09-09 MED ORDER — HALOPERIDOL 5 MG PO TABS
5.0000 mg | ORAL_TABLET | Freq: Two times a day (BID) | ORAL | Status: DC
  Administered 2019-09-09: 10:00:00 5 mg via ORAL
  Filled 2019-09-09: qty 1

## 2019-09-09 NOTE — ED Notes (Signed)
Hourly rounding reveals patient in room. No complaints, stable, in no acute distress. Q15 minute rounds and monitoring via Rover and Officer to continue.   

## 2019-09-09 NOTE — ED Notes (Signed)
Pt given a phone to call ride. States that he got someone and he can go to lobby.
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
## 2019-09-09 NOTE — ED Notes (Signed)
Pt given meal tray.

## 2019-09-09 NOTE — ED Provider Notes (Signed)
-----------------------------------------   5:57 AM on 09/09/2019 -----------------------------------------   Blood pressure (!) 143/68, pulse (!) 113, temperature 97.6 F (36.4 C), temperature source Oral, resp. rate 18, height 6\' 1"  (1.854 m), weight 95.3 kg, SpO2 97 %.  The patient is sleeping at this time.  There have been no acute events since the last update.  Awaiting disposition plan from Behavioral Medicine.   , MD 09/09/19 [phone]

## 2019-09-09 NOTE — ED Notes (Signed)
Pt pleasant and calm upon assessment. Pt ate 100% of breakfast. Pt states that he slept well. Pt denies any HI/SI or voices at this time. Pt not responding to any delusions at this time. Pt not looking around room or talking to any halluncinations at this time.

## 2019-09-09 NOTE — Discharge Instructions (Signed)
Substance Use Disorder Substance use disorder occurs when a person's repeated use of drugs or alcohol interferes with his or her ability to be productive. This disorder can cause problems with mental and physical health. It can affect your ability to have healthy relationships, and it can keep you from being able to meet your responsibilities at work, home, or school. It can also lead to addiction, which is a condition in which the person cannot stop using the substance consistently for a period of time. Addiction changes the way the brain works. Because of these changes, addiction is a chronic condition. Substance use disorder can be mild, moderate, or severe. The most commonly abused substances include:  Alcohol.  Tobacco.  Marijuana.  Stimulants, such as cocaine and methamphetamine.  Hallucinogens, such as LSD and PCP.  Opioids, such as some prescription pain medicines and heroin. What are the causes? This condition may develop due to many complex social, psychological, or physical reasons, such as:  Stress.  Abuse.  Peer pressure.  Anxiety or depression. What increases the risk? This condition is more likely to develop in people who:  Use substances to cope with stress.  Have been abused.  Have a mental health disorder, such as depression.  Have a family history of substance use disorder. What are the signs or symptoms? Symptoms of this condition include:  Using the substance for longer periods of time or at a higher dosage than what is normal or intended.  Having a lasting desire to use the substance.  Being unable to slow down or stop the use of the substance.  Spending an abnormal amount of time getting the substance, using the substance, or recovering from using the substance.  Using the substance in a way that interferes with work, school, social activities, and personal relationships.  Using the substance even after having negative consequences, such  as: ? Health problems. ? Legal or financial troubles. ? Job loss. ? Relationship problems.  Needing more and more of the substance to get the same effect (developing tolerance).  Experiencing unpleasant symptoms if you do not use the substance (withdrawal).  Using the substance to avoid withdrawal symptoms. How is this diagnosed? This condition may be diagnosed based on:  A physical exam.  Your history of substance use.  Your symptoms. This includes: ? How substance use affects your life. ? Changes in personality, behaviors, and mood. ? Having at least two symptoms of substance use disorder within a 12-month period. ? Health issues related to substance use, such as liver damage, shortness of breath, fatigue, cough, or heart problems.  Blood or urine tests to screen for alcohol and drugs. How is this treated? This condition may be treated by:  Stopping substance use safely. This may require taking medicines and being closely monitored for several days.  Taking part in group and individual counseling from mental health providers who help people with substance use disorder.  Staying at a live-in (residential) treatment center for several days or weeks.  Attending daily counseling sessions at a treatment center.  Taking medicine as told by your health care provider: ? To ease symptoms and prevent complications during withdrawal. ? To treat other mental health issues, such as depression or anxiety. ? To block cravings by causing the same effects as the substance. ? To block the effects of the substance or replace good sensations with unpleasant ones.  Participating in a support group to share your experience with others who are going through the same thing. These   groups are an important part of long-term recovery for many people. Recovery can be a long process. Many people who undergo treatment start using the substance again after stopping (relapse). If you relapse, that does  not mean that treatment will not work. Follow these instructions at home:   Take over-the-counter and prescription medicines only as told by your health care provider.  Do not use any drugs or alcohol.  Avoid temptations or triggers that you associate with your use of the substance.  Learn and practice techniques for managing stress.  Have a plan for vulnerable moments. Get phone numbers of people who are willing to help and who are committed to your recovery.  Attend support groups on a regular basis. These groups include 12-step programs like Alcoholics Anonymous and Narcotics Anonymous.  Keep all follow-up visits as told by your health care providers. This is important. This includes continuing to work with therapists and support groups. Contact a health care provider if:  You cannot take your medicines as told.  Your symptoms get worse.  You have trouble resisting the urge to use drugs or alcohol. Get help right away if you:  Relapse.  Think that you may have taken too much of a drug. The hotline of the National Poison Control Center is (800) 222-1222.  Have signs of an overdose. Symptoms include: ? Chest pain. ? Confusion. ? Sleepiness or difficulty staying awake. ? Slowed breathing. ? Nausea or vomiting. ? A seizure.  Have serious thoughts about hurting yourself or someone else. Drug overdose is an emergency. Do not wait to see if the symptoms will go away. Get medical help right away. Call your local emergency services (911 in the [place] Do not drive yourself to the hospital. If you ever feel like you may hurt yourself or others, or have thoughts about taking your own life, get help right away. You can go to your nearest emergency department or call:  Your local emergency services (911 in the [place]  A suicide crisis helpline, such as the National Suicide Prevention Lifeline at 1-800-273-8255. This is open 24 hours a day. Summary  Substance use disorder occurs  when a person's repeated use of drugs or alcohol interferes with his or her ability to be productive.  Taking part in group and individual counseling from mental health providers is a common treatment for people with substance use disorder.  Recovery can be a long process. Many people who undergo treatment start using the substance again after stopping (relapse). A relapse does not mean that treatment will not work.  Attend support groups such as Alcoholics Anonymous and Narcotics Anonymous. These groups are an important part of long-term recovery for many people. This information is not intended to replace advice given to you by your health care provider. Make sure you discuss any questions you have with your health care provider. Document Revised: 12/07/2018 Document Reviewed: 09/27/2017 Elsevier Patient Education  2020 Elsevier Inc.  

## 2020-08-06 ENCOUNTER — Encounter: Payer: Self-pay | Admitting: Emergency Medicine

## 2020-08-06 ENCOUNTER — Other Ambulatory Visit: Payer: Self-pay

## 2020-08-06 ENCOUNTER — Emergency Department
Admission: EM | Admit: 2020-08-06 | Discharge: 2020-08-08 | Disposition: A | Discharge status: Home / Self Care | Payer: Self-pay | Attending: Emergency Medicine | Admitting: Emergency Medicine

## 2020-08-06 ENCOUNTER — Emergency Department: Payer: Self-pay

## 2020-08-06 DIAGNOSIS — Z20822 Contact with and (suspected) exposure to covid-19: Secondary | ICD-10-CM | POA: Insufficient documentation

## 2020-08-06 DIAGNOSIS — W228XXA Striking against or struck by other objects, initial encounter: Secondary | ICD-10-CM | POA: Insufficient documentation

## 2020-08-06 DIAGNOSIS — F3189 Other bipolar disorder: Secondary | ICD-10-CM | POA: Diagnosis present

## 2020-08-06 DIAGNOSIS — F15959 Other stimulant use, unspecified with stimulant-induced psychotic disorder, unspecified: Secondary | ICD-10-CM | POA: Diagnosis present

## 2020-08-06 DIAGNOSIS — F1414 Cocaine abuse with cocaine-induced mood disorder: Secondary | ICD-10-CM | POA: Insufficient documentation

## 2020-08-06 DIAGNOSIS — F15159 Other stimulant abuse with stimulant-induced psychotic disorder, unspecified: Secondary | ICD-10-CM | POA: Insufficient documentation

## 2020-08-06 DIAGNOSIS — F1994 Other psychoactive substance use, unspecified with psychoactive substance-induced mood disorder: Secondary | ICD-10-CM | POA: Diagnosis present

## 2020-08-06 DIAGNOSIS — Z79899 Other long term (current) drug therapy: Secondary | ICD-10-CM | POA: Insufficient documentation

## 2020-08-06 DIAGNOSIS — F23 Brief psychotic disorder: Secondary | ICD-10-CM

## 2020-08-06 DIAGNOSIS — R4689 Other symptoms and signs involving appearance and behavior: Secondary | ICD-10-CM

## 2020-08-06 DIAGNOSIS — S6000XA Contusion of unspecified finger without damage to nail, initial encounter: Secondary | ICD-10-CM

## 2020-08-06 DIAGNOSIS — S60221A Contusion of right hand, initial encounter: Secondary | ICD-10-CM | POA: Insufficient documentation

## 2020-08-06 DIAGNOSIS — F1510 Other stimulant abuse, uncomplicated: Secondary | ICD-10-CM | POA: Diagnosis present

## 2020-08-06 DIAGNOSIS — R410 Disorientation, unspecified: Secondary | ICD-10-CM

## 2020-08-06 DIAGNOSIS — F1410 Cocaine abuse, uncomplicated: Secondary | ICD-10-CM | POA: Diagnosis present

## 2020-08-06 DIAGNOSIS — F17210 Nicotine dependence, cigarettes, uncomplicated: Secondary | ICD-10-CM | POA: Insufficient documentation

## 2020-08-06 LAB — BASIC METABOLIC PANEL
Anion gap: 12 (ref 5–15)
BUN: 19 mg/dL (ref 6–20)
CO2: 20 mmol/L — ABNORMAL LOW (ref 22–32)
Calcium: 9 mg/dL (ref 8.9–10.3)
Chloride: 107 mmol/L (ref 98–111)
Creatinine, Ser: 0.84 mg/dL (ref 0.61–1.24)
GFR, Estimated: >60 mL/min (ref >60)
Glucose, Bld: 117 mg/dL — ABNORMAL HIGH (ref 70–99)
Potassium: 3.5 mmol/L (ref 3.5–5.1)
Sodium: 139 mmol/L (ref 135–145)

## 2020-08-06 LAB — RESP PANEL BY RT-PCR (FLU A&B, COVID) ARPGX2
Influenza A by PCR: NEGATIVE
Influenza B by PCR: NEGATIVE
SARS Coronavirus 2 by RT PCR: NEGATIVE

## 2020-08-06 LAB — CBC
HCT: 44.4 % (ref 39.0–52.0)
Hemoglobin: 15.7 g/dL (ref 13.0–17.0)
MCH: 32.9 pg (ref 26.0–34.0)
MCHC: 35.4 g/dL (ref 30.0–36.0)
MCV: 93.1 fL (ref 80.0–100.0)
Platelets: 287 10*3/uL (ref 150–400)
RBC: 4.77 MIL/uL (ref 4.22–5.81)
RDW: 12.9 % (ref 11.5–15.5)
WBC: 10.6 10*3/uL — ABNORMAL HIGH (ref 4.0–10.5)
nRBC: 0 % (ref 0.0–0.2)

## 2020-08-06 LAB — SALICYLATE LEVEL: Salicylate Lvl: <7 mg/dL — ABNORMAL LOW (ref 7.0–30.0)

## 2020-08-06 LAB — ETHANOL: Alcohol, Ethyl (B): <10 mg/dL (ref <10)

## 2020-08-06 LAB — ACETAMINOPHEN LEVEL: Acetaminophen (Tylenol), Serum: <10 ug/mL — ABNORMAL LOW (ref 10–30)

## 2020-08-06 MED ORDER — ZIPRASIDONE MESYLATE 20 MG IM SOLR
20.0000 mg | Freq: Once | INTRAMUSCULAR | Status: AC
  Administered 2020-08-06: 20 mg via INTRAMUSCULAR

## 2020-08-06 MED ORDER — LORAZEPAM 2 MG PO TABS: 0.0000 mg | ORAL_TABLET | Freq: Two times a day (BID) | ORAL | Status: DC

## 2020-08-06 MED ORDER — THIAMINE HCL 100 MG/ML IJ SOLN: 100.0000 mg | Freq: Every day | INTRAMUSCULAR | Status: DC

## 2020-08-06 MED ORDER — LORAZEPAM 2 MG PO TABS
0.0000 mg | ORAL_TABLET | Freq: Four times a day (QID) | ORAL | Status: DC
  Administered 2020-08-06 – 2020-08-07 (×2): 2 mg via ORAL
  Filled 2020-08-06 (×2): qty 1
  Filled 2020-08-06: qty 2

## 2020-08-06 MED ORDER — THIAMINE HCL 100 MG PO TABS
100.0000 mg | ORAL_TABLET | Freq: Every day | ORAL | Status: DC
  Administered 2020-08-07: 100 mg via ORAL
  Filled 2020-08-06: qty 1

## 2020-08-06 MED ORDER — LORAZEPAM 2 MG/ML IJ SOLN: 0.0000 mg | Freq: Two times a day (BID) | INTRAMUSCULAR | Status: DC

## 2020-08-06 MED ORDER — ZIPRASIDONE MESYLATE 20 MG IM SOLR
20.0000 mg | Freq: Once | INTRAMUSCULAR | Status: AC
  Administered 2020-08-06: 20 mg via INTRAMUSCULAR
  Filled 2020-08-06: qty 20

## 2020-08-06 MED ORDER — LORAZEPAM 2 MG/ML IJ SOLN
0.0000 mg | Freq: Four times a day (QID) | INTRAMUSCULAR | Status: DC
  Filled 2020-08-06: qty 1

## 2020-08-06 MED ORDER — LORAZEPAM 2 MG/ML IJ SOLN
2.0000 mg | Freq: Once | INTRAMUSCULAR | Status: AC
  Administered 2020-08-06: 2 mg via INTRAMUSCULAR

## 2020-08-06 NOTE — ED Notes (Signed)
Patient transferred from Triage to room 20 after dressing out and screening for contraband. Report received from [place], [place] including situation, background, assessment and recommendations. Pt oriented to [employer] including Q15 minute rounds as well as Psychologist, counselling for their protection. Patient is alert and oriented, warm and dry in no acute distress. Patient denies SI, HI, and VH. Pt. Encouraged to let this nurse know if needs arise.

## 2020-08-06 NOTE — ED Notes (Signed)
Pt remains standing in room with arms crossed, appears angry
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
## 2020-08-06 NOTE — ED Notes (Addendum)
Pt is fearful and defiant in entering room, states that he needs to be in the hallway like he is every other time he is here. Pt required to be coaxed to enter room. After entering pt is unhappy and standing in room with arms crossed continuing stating he needs to be in hallway. Pt reports meth use tonight and "a couple hits of drugs." States that staff and family at home are setting him up. When officers were with pt, pt tells them to kill him, reassured that this is not what we do in ER.

## 2020-08-06 NOTE — Consult Note (Signed)
  Patient has been seen or I have attempted to see him and chart has been reviewed.  Been following him all day.  54 year old man with a history of stimulant abuse came into the hospital agitated paranoid and delirious.  He has remained delirious and confused throughout the day requiring several doses of medication to control agitated behavior.  We will continue to follow-up when he becomes lucid.

## 2020-08-06 NOTE — ED Notes (Signed)
Pt awoken and pacing in hallway. Pt going up to other patients in the quad saying "that's my niece, I'm trying to talk to you". This RN attempted to redirect patient and reinforce that patient was in hospital. Pt non-compliant and not wanting to return to room. This RN attempted to give patient oral medications. Pt very paranoid and stating "Y'all are trying to kill me. Please don't kill me Annice Pih". Pt unable to be reoriented. IM medications ordered and administered by this RN. Pt compliant with IM medication. Pt in bed now resting comfortably. Pt respirations even and unlabored at this time.

## 2020-08-06 NOTE — ED Notes (Signed)
Pt continues to be agitated in room, standing with arms crossed. Pt will occasionally yell "help" Pt requires repeated reorientation, pt will speak to individual in head named Annice Pih and then say things to his cousins, stats that they sent him here to be killed. Requests staff to just kill him and to shoot him now, pt will not be reoriented or calmed. Security and officers at area for pt safety, will continue to monitor.

## 2020-08-06 NOTE — BH Assessment (Signed)
TTS attempted to complete assessment but was unsuccessful due to pt actively withdrawing (shaking, tremors, moaning) and unable to participate in the assessment.   TTS updated Pattricia Boss, RN & Dr. Vicente Males

## 2020-08-06 NOTE — ED Notes (Signed)
Pt awakening and arguing with officer Jamelle Haring. Pt continually coming out of room attempting to go into other patient's room stating "that's my momma, are you okay momma". Pt hard to redirect to room. Orders for IM medication placed by MD Bradler.

## 2020-08-06 NOTE — ED Triage Notes (Addendum)
Pt to triage via w/c, in custody of [place] [employer] for IVC; pt st "I was hearing and seeing stuff"; denies SI or HI; pt reports he punched wall with his rt hand tonight as well; swelling & pain

## 2020-08-06 NOTE — ED Notes (Addendum)
Pt. Being dressed out by this tech and officer inside room. Pt was provided with hospital attire, blue top&bottom paper scrubs. Belonging were placed with his name and label that was provided by the hospital.   Belonging are:  Black t-shirt White pair of shoes White socks Black underwear [facility]

## 2020-08-06 NOTE — ED Notes (Signed)
Pt transferred into ED BHU room 4   Patient assigned to appropriate care area. Patient oriented to unit/care area: Informed that, for his safety, care areas are designed for safety and monitored by security cameras at all times; Visiting hours and phone times explained to patient. Patient verbalizes understanding, and verbal contract for safety obtained.   Assessment completed  He denies pain   

## 2020-08-06 NOTE — ED Notes (Signed)
Pt. Alert and oriented, warm and dry, in no distress. Pt. Denies SI, HI. Patient talking to doors appears to be having hallucinations.  Pt. Encouraged to let nursing staff know of any concerns or needs.  ENVIRONMENTAL ASSESSMENT Potentially harmful objects out of patient reach: Yes.   Personal belongings secured: Yes.   Patient dressed in hospital provided attire only: Yes.   Plastic bags out of patient reach: Yes.   Patient care equipment (cords, cables, call bells, lines, and drains) shortened, removed, or accounted for: Yes.   Equipment and supplies removed from bottom of stretcher: Yes.   Potentially toxic materials out of patient reach: Yes.   Sharps container removed or out of patient reach: Yes.

## 2020-08-06 NOTE — ED Notes (Signed)
Pt received dinner tray.

## 2020-08-06 NOTE — ED Notes (Signed)
Hourly rounding completed at this time, patient currently awake in room. No complaints, stable, and in no acute distress. Q15 minute rounds and monitoring via [place] and Officer to continue. °

## 2020-08-06 NOTE — BH Assessment (Signed)
TTS and psych attempted to assess pt but was unsuccessful due to pt's current disorganized and incoherent presentation.   Psych team will continue to assess pt accordingly

## 2020-08-06 NOTE — ED Provider Notes (Signed)
[facility] Emergency Department Provider Note  ____________________________________________   First MD Initiated Contact with Patient 08/06/20 [phone]     (approximate)  I have reviewed the triage vital signs and the nursing notes.   HISTORY  Chief Complaint Mental Health Problem  Level 5 caveat:  history/ROS limited by active psychosis / mental illness / altered mental status   HPI Harold Brooks is a 54 y.o. male with a known history of schizophrenia who presents under involuntary commitment by Patent examiner.  Reportedly he has been using methamphetamine and threatening to kill his brother.  He is minimally cooperative and overtly aggressive and threatening.  His speech is pressured and rapid and he seems to be having flight of ideas and is unable to carry on a conversation.  He is not engaged in physical violence yet but he is cursing at me and ED staff as well as law enforcement and threatening to kill people as well as trying to intimidates physically.  He reportedly punched a wall earlier tonight and has some swelling of his hand but he does not have any other complaints that he will verbalize.  He cannot give any coherent history.         Past Medical History:  Diagnosis Date  . Kidney stones   . Schizophrenia [facility])     Patient Active Problem List   Diagnosis Date Noted  . 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 Paranoia (HCC)   . 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 Substance induced mood disorder (HCC) 12/05/2014  . 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 PTSD (post-traumatic stress disorder) 01/13/2013  . Cocaine abuse (HCC) 01/13/2013    History reviewed. No pertinent surgical history.  Prior to Admission medications   Medication Sig Start Date End Date Taking? Authorizing Provider  chlorproMAZINE (THORAZINE) 200 MG tablet  Take 1 tablet (200 mg total) by mouth 2 (two) times daily. For mood control 12/09/14   Armandina Stammer I, NP  haloperidol (HALDOL) 5 MG tablet Take 1 tablet (5 mg total) by mouth 2 (two) times daily. 09/09/19   Charm Rings, NP  pantoprazole (PROTONIX) 40 MG tablet Take 1 tablet (40 mg total) by mouth daily. For acid reflux 12/09/14   Armandina Stammer I, NP  QUEtiapine (SEROQUEL) 300 MG tablet Take 1 tablet (300 mg total) by mouth at bedtime. For mood control 12/09/14   Sanjuana Kava, NP    Allergies Patient has no known allergies.  No family history on file.  Social History Social History   Tobacco Use  . Smoking status: Current Every Day Smoker    Types: Cigarettes  . Smokeless tobacco: Never Used  Vaping Use  . Vaping Use: Never used  Substance Use Topics  . Alcohol use: No  . Drug use: Yes    Types: Cocaine, Marijuana, Benzodiazepines, Methamphetamines    Comment: patient reported to BP he is using meth now, not crack    Review of Systems Level 5 caveat:  history/ROS limited by active psychosis / mental illness / altered mental status  ____________________________________________   PHYSICAL EXAM:  VITAL SIGNS: ED Triage Vitals  Enc Vitals Group     BP 08/06/20 0416 (!) 151/105     Pulse Rate 08/06/20 0416 (!) 120     Resp 08/06/20 0416 20     Temp 08/06/20 0416 98.7 F (37.1 C)     Temp Source 08/06/20 0416  Oral     SpO2 08/06/20 0416 97 %     Weight 08/06/20 0313 93 kg (205 lb)     Height 08/06/20 0313 1.854 m (6\' 1" )     Head Circumference --      Peak Flow --      Pain Score 08/06/20 0313 1     Pain Loc --      Pain Edu? --      Excl. in GC? --     Constitutional: Awake and alert, no apparent physical distress, verbally hostile and physically threatening to staff and law enforcement. Eyes: Conjunctivae are normal.  Head: No obvious traumatic injuries to his head. Nose: No congestion/rhinnorhea. Mouth/Throat: Patient is wearing a mask. Neck: No stridor.  No  meningeal signs.   Cardiovascular: Tachycardia. Respiratory: Normal respiratory effort.  No retractions. Gastrointestinal: Soft and nondistended. Musculoskeletal: Obvious swelling of his right hand with no gross deformities.  Patient will not allow physical exam other than at a distance. Neurologic:  Rapid speech and language. No gross focal neurologic deficits are appreciated.  Patient is ambulatory without difficulty. Skin:  Skin is warm, dry and intact. Psychiatric: Mood and affect are pressured, threatening, labile, flight of ideas and tangential thoughts.  He initially told his nurse that he was seeing and hearing things but then he subsequently denied it.  He reportedly stated he was going to kill his brother and made nonspecific threats to kill people to me when I interviewed him.  ____________________________________________   LABS (all labs ordered are listed, but only abnormal results are displayed)  Labs Reviewed  CBC - Abnormal; Notable for the following components:      Result Value   WBC 10.6 (*)    All other components within normal limits  ACETAMINOPHEN LEVEL - Abnormal; Notable for the following components:   Acetaminophen (Tylenol), Serum <10 (*)    All other components within normal limits  SALICYLATE LEVEL - Abnormal; Notable for the following components:   Salicylate Lvl <7.0 (*)    All other components within normal limits  BASIC METABOLIC PANEL - Abnormal; Notable for the following components:   CO2 20 (*)    Glucose, Bld 117 (*)    All other components within normal limits  ETHANOL  URINALYSIS, COMPLETE (UACMP) WITH MICROSCOPIC  URINE DRUG SCREEN, QUALITATIVE ([facility] ONLY)   ____________________________________________  EKG  No indication for emergent EKG ____________________________________________  RADIOLOGY 14/08/21, personally viewed and evaluated these images (plain radiographs) as part of my medical decision making, as well as reviewing the  written report by the radiologist.  ED MD interpretation: No evidence of fracture or dislocation, soft tissue swelling is present.  Official radiology report(s): DG Hand Complete Right  Result Date: 08/06/2020 CLINICAL DATA:  Injury to hand EXAM: RIGHT HAND - COMPLETE 3+ VIEW COMPARISON:  None. FINDINGS: There is no evidence of fracture or dislocation. There is no evidence of arthropathy or other focal bone abnormality. Soft tissue swelling seen over the fifth digit and dorsum of the hand. IMPRESSION: No acute osseous abnormality. Soft tissue swelling seen over the dorsum of the hand. Electronically Signed   By: 14/03/2020 M.D.   On: 08/06/2020 03:49    ____________________________________________   PROCEDURES   Procedure(s) performed (including Critical Care):  Procedures   ____________________________________________   INITIAL IMPRESSION / MDM / ASSESSMENT AND PLAN / ED COURSE  As part of my medical decision making, I reviewed the following data within the electronic MEDICAL RECORD NUMBER  Nursing notes reviewed and incorporated, Labs reviewed , Old chart reviewed, A consult was requested and obtained from this/these consultant(s) Psychiatry, Notes from prior ED visits and [place] Controlled Substance Database   Differential diagnosis includes, but is not limited to, schizophrenia, other nonspecific acute psychosis, substance-induced mood disorder, stimulant abuse, other nonspecific intoxication.  Infectious process is less likely.  Patient's vital signs are acceptable; tachycardia is likely due to amphetamine use and his current psychotic state.  He is threatening physical violence, clearly does not have the capacity to make decisions on his own, and is a danger to himself and others.  After assessing the patient I ordered Geodon 20 mg intramuscular as a calming agent which he excepted without the need for physical restraint due largely to show forced by 1 enforcement and ED staff.  Labs  are reassuring including normal metabolic panel, negative acetaminophen and salicylate level, negative ethanol level, and essentially normal CBC.  Patient has not provided a urine specimen.  I personally reviewed the patient's imaging and agree with the radiologist's interpretation that there is no evidence of fracture or dislocation.  There is no evidence of an acute medical condition right now and he is cleared for psychiatric evaluation and disposition.  The patient has been placed in psychiatric observation due to the need to provide a safe environment for the patient while obtaining psychiatric consultation and evaluation, as well as ongoing medical and medication management to treat the patient's condition.  The patient has been placed under full IVC at this time.   ____________________________________________  FINAL CLINICAL IMPRESSION(S) / ED DIAGNOSES  Final diagnoses:  Acute psychosis (HCC)  Aggressive behavior  Contusion of finger of right hand, initial encounter     MEDICATIONS GIVEN DURING THIS VISIT:  Medications  ziprasidone (GEODON) injection 20 mg (20 mg Intramuscular Given 08/06/20 0553)     ED Discharge Orders    None      *Please note:  Harold Brooks was evaluated in Emergency Department on 08/06/2020 for the symptoms described in the history of present illness. He was evaluated in the context of the global COVID-19 pandemic, which necessitated consideration that the patient might be at risk for infection with the SARS-CoV-2 virus that causes COVID-19. Institutional protocols and algorithms that pertain to the evaluation of patients at risk for COVID-19 are in a state of rapid change based on information released by regulatory bodies including the CDC and federal and state organizations. These policies and algorithms were followed during the patient's care in the ED.  Some ED evaluations and interventions may be delayed as a result of limited staffing during and  after the pandemic.*  Note:  This document was prepared using Dragon voice recognition software and may include unintentional dictation errors.   Loleta Rose, MD 08/06/20 [phone]

## 2020-08-06 NOTE — ED Notes (Signed)
IVC  SEEN  BY  DR  [employer]

## 2020-08-07 DIAGNOSIS — R410 Disorientation, unspecified: Secondary | ICD-10-CM

## 2020-08-07 NOTE — BH Assessment (Addendum)
Assessment Note  Harold Brooks is an 54 y.o. male who presents to [facility] ED involuntarily for treatment. Per triage note, Pt to triage via w/c, in custody of [place] PD for IVC; pt st "I was hearing and seeing stuff"; denies SI or HI; pt reports he punched wall with his rt hand tonight as well; swelling & pain.  During TTS assessment pt presents incoherent, tangential, restless, irritable, uncooperative, and mood-congruent with affect. The pt does not appear to be responding to internal or external stimuli. Neither is the pt presenting with any delusional thinking. Pt is currently unable to verify the information provided to the triage RN. Several attempts have been made to see pt that were unsuccessful due to pt's current presentation. TTS attempted to complete assessment this morning and was only able to assess pt's SI and recent SA. Pt reports using cocaine and meth but is unable to provide any further informaiton. Pt denied any current SI/HI and did not respond when asked about hallucinations and UDS needed. Pt grew irritable and asked to be left alone. TTS completed pt's assessment using pt's chart hx and current IVC. Per pt's chart pt has a hx of stimulant abuse, agitation, depression, schizophrenia, paranoia, and delirious symptoms.  Pt currently remains delirious and confused and has required several doses of medication to control agitation.   Pt's IVC reports: Respondent made threat to kill his brother. Respondent using methamphetamine and not taking his prescribed medication. Respondent is hallucinating and telling officers to shoot him.   Per Dr. Toni Amend pt does not meet criteria for psychiatric inpatient admission.  Diagnosis: Per hx Schizophrenia   Past Medical History:  Past Medical History:  Diagnosis Date  . Kidney stones   . Schizophrenia (HCC)     History reviewed. No pertinent surgical history.  Family History: No family history on file.  Social History:  reports that he  has been smoking cigarettes. He has never used smokeless tobacco. He reports current drug use. Drugs: Cocaine, Marijuana, Benzodiazepines, and Methamphetamines. He reports that he does not drink alcohol.  Additional Social History:  Alcohol / Drug Use Pain Medications: see mar Prescriptions: see mar Over the Counter: see mar History of alcohol / drug use?: Yes Substance #1 Name of Substance 1: Methamphetamine Substance #2 Name of Substance 2: Cocaine Substance #3 Name of Substance 3: Marijuana  CIWA: CIWA-Ar BP: 108/73 Pulse Rate: 80 Nausea and Vomiting: no nausea and no vomiting Tactile Disturbances: mild itching, pins and needles, burning or numbness Tremor: not visible, but can be felt fingertip to fingertip Auditory Disturbances: very mild harshness or ability to frighten Paroxysmal Sweats: barely perceptible sweating, palms moist Visual Disturbances: not present Anxiety: two Headache, Fullness in Head: moderate Agitation: normal activity Orientation and Clouding of Sensorium: cannot do serial additions or is uncertain about date CIWA-Ar Total: 11 COWS:    Allergies: No Known Allergies  Home Medications: (Not in a hospital admission)   OB/GYN Status:  No LMP for male patient.  General Assessment Data Location of Assessment: [facility] ED TTS Assessment: In system Is this a Tele or Face-to-Face Assessment?: Face-to-Face Is this an Initial Assessment or a Re-assessment for this encounter?: Initial Assessment Patient Accompanied by:: N/A Language Other than English: No Living Arrangements:  (UTA) What gender do you identify as?: Male Date Telepsych consult ordered in [facility]: 08/06/20 Time Telepsych consult ordered in [facility]: 0301 Marital status:  (UTA) Pregnancy Status: No Living Arrangements: Alone Can pt return to current living arrangement?: Yes Admission Status:  Involuntary Petitioner: Police Is patient capable of signing voluntary admission?: No Referral Source:  Other Insurance type: None     Crisis Care Plan Living Arrangements: Alone Legal Guardian:  (self) Name of Psychiatrist:  (UTA) Name of Therapist: UTA  Education Status Is patient currently in school?: No Is the patient employed, unemployed or receiving disability?:  (UTA)  Risk to self with the past 6 months Suicidal Ideation: No (UTA) Has patient been a risk to self within the past 6 months prior to admission? : No Suicidal Intent: No Has patient had any suicidal intent within the past 6 months prior to admission? : No Is patient at risk for suicide?: No, but patient needs Medical Clearance Suicidal Plan?: No Has patient had any suicidal plan within the past 6 months prior to admission? : No Access to Means: No What has been your use of drugs/alcohol within the last 12 months?: Cocaine, Methamphetamine, Marijuana Previous Attempts/Gestures: Yes How many times?: 1 Other Self Harm Risks:  (uta) Triggers for Past Attempts: Unknown Intentional Self Injurious Behavior: None Family Suicide History: Unable to assess Recent stressful life event(s):  (UTA) Persecutory voices/beliefs?:  (UTA) Depression: Yes Depression Symptoms: Feeling angry/irritable Substance abuse history and/or treatment for substance abuse?: Yes Suicide prevention information given to non-admitted patients: Not applicable  Risk to Others within the past 6 months Homicidal Ideation: No Does patient have any lifetime risk of violence toward others beyond the six months prior to admission? : Unknown Thoughts of Harm to Others: No Current Homicidal Intent: No Current Homicidal Plan: No Access to Homicidal Means: No Identified Victim: N/A History of harm to others?: No Assessment of Violence: In past 6-12 months Violent Behavior Description: Agitation Does patient have access to weapons?:  (UTA) Criminal Charges Pending?:  (UTA) Does patient have a court date:  (UTA) Is patient on probation?:  Unknown  Psychosis Hallucinations: Auditory,Visual (Per IVC & Triage note) Delusions: None noted  Mental Status Report Appearance/Hygiene: In scrubs Eye Contact: Unable to Assess Motor Activity: Unable to assess Speech: Incoherent,Slurred,Slow Level of Consciousness: Restless,Irritable Mood: Depressed,Irritable Affect: Depressed,Irritable Anxiety Level: None Thought Processes: Unable to Assess Judgement: Impaired Orientation: Unable to assess Obsessive Compulsive Thoughts/Behaviors: Unable to Assess  Cognitive Functioning Concentration: Unable to Assess Memory: Unable to Assess Is patient IDD: No Insight: Unable to Assess Impulse Control: Poor Appetite:  (UTA) Have you had any weight changes? :  (UTA) Sleep: Unable to Assess Total Hours of Sleep:  (UTA) Vegetative Symptoms: None  ADLScreening [facility] Assessment Services) Patient's cognitive ability adequate to safely complete daily activities?: Yes Patient able to express need for assistance with ADLs?: Yes Independently performs ADLs?: Yes (appropriate for developmental age)  Prior Inpatient Therapy Prior Inpatient Therapy: No  Prior Outpatient Therapy Prior Outpatient Therapy: No Does patient have an ACCT team?: No Does patient have Intensive In-House Services?  : No Does patient have [facility] services? : Unknown Does patient have P4CC services?: Unknown  ADL Screening (condition at time of admission) Patient's cognitive ability adequate to safely complete daily activities?: Yes Is the patient deaf or have difficulty hearing?: No Does the patient have difficulty seeing, even when wearing glasses/contacts?: No Does the patient have difficulty concentrating, remembering, or making decisions?: No Patient able to express need for assistance with ADLs?: Yes Does the patient have difficulty dressing or bathing?: No Independently performs ADLs?: Yes (appropriate for developmental age) Does the patient have difficulty  walking or climbing stairs?: No Weakness of Legs: None Weakness of Arms/Hands: None  Home  Assistive Devices/Equipment Home Assistive Devices/Equipment: None  Therapy Consults (therapy consults require a physician order) PT Evaluation Needed: No OT Evalulation Needed: No SLP Evaluation Needed: No Abuse/Neglect Assessment (Assessment to be complete while patient is alone) Abuse/Neglect Assessment Can Be Completed: Unable to assess, patient is non-responsive or altered mental status Values / Beliefs Cultural Requests During Hospitalization: None Spiritual Requests During Hospitalization: None Consults Spiritual Care Consult Needed: No Transition of Care Team Consult Needed: No Advance Directives (For Healthcare) Does Patient Have a Medical Advance Directive?: No Would patient like information on creating a medical advance directive?: No - Patient declined          Disposition:  Disposition Initial Assessment Completed for this Encounter: Yes Patient referred to: Other (Comment)  On Site Evaluation by:   Reviewed with Physician:    Opal Sidles 08/07/2020 9:13 AM

## 2020-08-07 NOTE — ED Notes (Signed)
Hourly rounding completed at this time, patient currently asleep in room. No complaints, stable, and in no acute distress. Q15 minute rounds and monitoring via Security Cameras to continue. 

## 2020-08-07 NOTE — ED Notes (Signed)
Patient states he can't lay down due to people trying to kill him.  Patient reassured no one here in unit is going to hurt him. Patient went into room and standing in the doorway.

## 2020-08-07 NOTE — ED Provider Notes (Signed)
Emergency Medicine Observation Re-evaluation Note  Harold Brooks is a 54 y.o. male, seen on rounds today.  Pt initially presented to the ED for complaints of Mental Health Problem Currently, the patient is calm and has no acute complaints.  Physical Exam  BP 108/73   Pulse 80   Temp 98.2 F (36.8 C) (Oral)   Resp 18   Ht 6\' 1"  (1.854 m)   Wt 93 kg   SpO2 95%   BMI 27.05 kg/m  Physical Exam General: Alert and oriented. Cardiac: Good peripheral perfusion. Lungs: Normal respiratory effort. Psych: Calm calm and behaving appropriately.  ED Course / MDM   I have reviewed the labs performed to date as well as medications administered while in observation.  There have been no recent changes in the last 24 hours.  Plan  Current plan is for disposition per psychiatry team recommendations. Patient is under full IVC at this time.   , MD 08/07/20 1553

## 2020-08-07 NOTE — ED Notes (Signed)
Pt is very cooperative at this time, states that he no longer is having AVH and reports understanding that he was experiencing AVH before now. Pt understanding of expectations tonight and expresses appreciation of care. Will continue to monitor.

## 2020-08-07 NOTE — ED Notes (Signed)
Hourly rounding completed at this time, patient currently asleep in room. No complaints, stable, and in no acute distress. Q15 minute rounds and monitoring via Rover and Officer to continue. 

## 2020-08-07 NOTE — ED Notes (Signed)
Pt given breakfast tray and orange juice. 

## 2020-08-07 NOTE — ED Notes (Signed)
Patient provided with snack and beverage at this time.  ?

## 2020-08-07 NOTE — ED Notes (Signed)
Patient is resting in bed.  Will continue to monitor.

## 2020-08-07 NOTE — ED Notes (Signed)
Patient going to other patient's rooms. Patient redirected.

## 2020-08-07 NOTE — ED Notes (Signed)
Report received from Dorothy, RN including  Situation, Background, Assessment, and Recommendations. Patient alert and oriented, warm and dry, in no acute distress. Patient denies SI, HI, AVH and pain. Patient made aware of Q15 minute rounds and security cameras for their safety. Patient instructed to come to this nurse with needs or concerns. 

## 2020-08-07 NOTE — Consult Note (Signed)
[facility] Face-to-Face Psychiatry Consult   Reason for Consult: Consult for 54 year old man who came into the emergency room agitated and confused with acute substance abuse Referring Physician: Siadecki Patient Identification: Harold Brooks MRN:  [id number] Principal Diagnosis: <principal problem not specified> Diagnosis:  Active Problems:   * No active hospital problems. *   Total Time spent with patient: 1 hour  Subjective:   Harold Brooks is a 54 y.o. male patient admitted with patient not able to give any information.  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  Patient had required doses of medication for agitation.  Since yesterday he has been sedated most of the time and when he does wake up has remained very confused.  I tried to wake him up this morning to talk.  Patient was not able to wake up completely.  The vocalizations he did make were nonsensical.  Muttered to himself pulled his blanket back up and went back to sleep.  Vital signs of been stable.  Labs have been unremarkable although he has not been able to give a urine drug screen.  Patient had been placed on detox protocol and has received some Ativan but largely has gone unmedicated for much of the last day. Past Psychiatric History: Multiple prior visits to the emergency room and psychiatry evaluations.  Diagnoses of psychotic disorder have been proposed in the past at times but it seems that by far the overwhelming problem has been his substance abuse problems with stimulants the drug of choice.  I do not see any evidence really of psychosis without stimulant abuse.  Risk to Self: Suicidal Ideation: No (UTA) Suicidal Intent: No Is patient at risk for suicide?: No, but patient needs Medical Clearance Suicidal Plan?: No Access to  Means: No What has been your use of drugs/alcohol within the last 12 months?: Cocaine, Methamphetamine, Marijuana How many times?: 1 Other Self Harm Risks:  (uta) Triggers for Past Attempts: Unknown Intentional Self Injurious Behavior: None Risk to Others: Homicidal Ideation: No Thoughts of Harm to Others: No Current Homicidal Intent: No Current Homicidal Plan: No Access to Homicidal Means: No Identified Victim: N/A History of harm to others?: No Assessment of Violence: In past 6-12 months Violent Behavior Description: Agitation Does patient have access to weapons?:  (UTA) Criminal Charges Pending?:  (UTA) 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  : No Does patient have [facility] services? : Unknown Does patient have P4CC services?: Unknown  Past Medical History:  Past Medical History:  Diagnosis Date  . Kidney stones   . Schizophrenia (HCC)    History reviewed. No pertinent surgical history. Family History: No family history on file. Family Psychiatric  History: None reported Social History:  Social History   Substance and Sexual Activity  Alcohol Use No     Social History   Substance and Sexual Activity  Drug Use Yes  . Types: Cocaine, Marijuana, Benzodiazepines, Methamphetamines   Comment: patient reported to BP he is using meth now, not crack    Social History   Socioeconomic History  . Marital status: Unknown    Spouse name: Not on file  . Number of children: Not on  file  . Years of education: Not on file  . Highest education level: Not on file  Occupational History  . Not on file  Tobacco Use  . Smoking status: Current Every Day Smoker    Types: Cigarettes  . Smokeless tobacco: Never Used  Vaping Use  . Vaping Use: Never used  Substance and Sexual Activity  .  Alcohol use: No  . Drug use: Yes    Types: Cocaine, Marijuana, Benzodiazepines, Methamphetamines    Comment: patient reported to BP he is using meth now, not crack  . Sexual activity: Yes    Birth control/protection: None  Other Topics Concern  . Not on file  Social History Narrative  . Not on file   Social Determinants of Health   Financial Resource Strain: Not on file  Food Insecurity: Not on file  Transportation Needs: Not on file  Physical Activity: Not on file  Stress: Not on file  Social Connections: Not on file   Additional Social History:    Allergies:  No Known Allergies  Labs:  Results for orders placed or performed during the hospital encounter of 08/06/20 (from the past 48 hour(s))  Ethanol     Status: None   Collection Time: 08/06/20  3:17 AM  Result Value Ref Range   Alcohol, Ethyl (B) <10 <10 mg/dL    Comment: (NOTE) Lowest detectable limit for serum alcohol is 10 mg/dL.  For medical purposes only. Performed at [facility], [address] [place], [place] [address]   CBC     Status: Abnormal   Collection Time: 08/06/20  3:17 AM  Result Value Ref Range   WBC 10.6 (H) 4.0 - 10.5 K/uL   RBC 4.77 4.22 - 5.81 MIL/uL   Hemoglobin 15.7 13.0 - 17.0 g/dL   HCT 19.1 47.8 - 29.5 %   MCV 93.1 80.0 - 100.0 fL   MCH 32.9 26.0 - 34.0 pg   MCHC 35.4 30.0 - 36.0 g/dL   RDW 62.1 30.8 - 65.7 %   Platelets 287 150 - 400 K/uL   nRBC 0.0 0.0 - 0.2 %    Comment: Performed at [facility], [address] [place], [place] [address]  Acetaminophen level     Status: Abnormal   Collection Time: 08/06/20  3:17 AM  Result Value Ref Range   Acetaminophen (Tylenol), Serum <10 (L) 10 - 30 ug/mL    Comment: (NOTE) Therapeutic concentrations vary significantly. A range of 10-30 ug/mL  may be an effective concentration for many patients. However, some  are best treated at concentrations outside of this range. Acetaminophen concentrations >150 ug/mL at 4  hours after ingestion  and >50 ug/mL at 12 hours after ingestion are often associated with  toxic reactions.  Performed at [facility], [address] [place], [place] [address]   Salicylate level     Status: Abnormal   Collection Time: 08/06/20  3:17 AM  Result Value Ref Range   Salicylate Lvl <7.0 (L) 7.0 - 30.0 mg/dL    Comment: Performed at [facility], [address], [place], [place] [address]  Basic metabolic panel     Status: Abnormal   Collection Time: 08/06/20  3:17 AM  Result Value Ref Range   Sodium 139 135 - 145 mmol/L   Potassium 3.5 3.5 - 5.1 mmol/L   Chloride 107 98 - 111 mmol/L   CO2 20 (L) 22 - 32 mmol/L   Glucose, Bld 117 (H) 70 - 99  mg/dL    Comment: Glucose reference range applies only to samples taken after fasting for at least 8 hours.   BUN 19 6 - 20 mg/dL   Creatinine, Ser 1.610.84 0.61 - 1.24 mg/dL   Calcium 9.0 8.9 - 09.610.3 mg/dL   GFR, Estimated >04>60 >54>60 mL/min    Comment: (NOTE) Calculated using the CKD-EPI Creatinine Equation (2021)    Anion gap 12 5 - 15    Comment: Performed at [facility], [address] [place], [place] [address]  Resp Panel by RT-PCR (Flu A&B, Covid) Nasopharyngeal Swab     Status: None   Collection Time: 08/06/20  4:38 PM   Specimen: Nasopharyngeal Swab; Nasopharyngeal(NP) swabs in vial transport medium  Result Value Ref Range   SARS Coronavirus 2 by RT PCR NEGATIVE NEGATIVE    Comment: (NOTE) SARS-CoV-2 target nucleic acids are NOT DETECTED.  The SARS-CoV-2 RNA is generally detectable in upper respiratory specimens during the acute phase of infection. The lowest concentration of SARS-CoV-2 viral copies this assay can detect is 138 copies/mL. A negative result does not preclude SARS-Cov-2 infection and should not be used as the sole basis for treatment or other patient management decisions. A negative result may occur with  improper specimen collection/handling, submission of specimen  other than nasopharyngeal swab, presence of viral mutation(s) within the areas targeted by this assay, and inadequate number of viral copies(<138 copies/mL). A negative result must be combined with clinical observations, patient history, and epidemiological information. The expected result is Negative.  Fact Sheet for Patients:  BloggerCourse.comhttps://www.fda.gov/media/152166/download  Fact Sheet for Healthcare Providers:  SeriousBroker.ithttps://www.fda.gov/media/152162/download  This test is no t yet approved or cleared by the [place] FDA and  has been authorized for detection and/or diagnosis of SARS-CoV-2 by FDA under an Emergency Use Authorization (EUA). This EUA will remain  in effect (meaning this test can be used) for the duration of the COVID-19 declaration under Section 564(b)(1) of the Act, 21 U.S.C.section 360bbb-3(b)(1), unless the authorization is terminated  or revoked sooner.       Influenza A by PCR NEGATIVE NEGATIVE   Influenza B by PCR NEGATIVE NEGATIVE    Comment: (NOTE) The Xpert Xpress SARS-CoV-2/FLU/RSV plus assay is intended as an aid in the diagnosis of influenza from Nasopharyngeal swab specimens and should not be used as a sole basis for treatment. Nasal washings and aspirates are unacceptable for Xpert Xpress SARS-CoV-2/FLU/RSV testing.  Fact Sheet for Patients: BloggerCourse.comhttps://www.fda.gov/media/152166/download  Fact Sheet for Healthcare Providers: SeriousBroker.ithttps://www.fda.gov/media/152162/download  This test is not yet approved or cleared by the [place] FDA and has been authorized for detection and/or diagnosis of SARS-CoV-2 by FDA under an Emergency Use Authorization (EUA). This EUA will remain in effect (meaning this test can be used) for the duration of the COVID-19 declaration under Section 564(b)(1) of the Act, 21 U.S.C. section 360bbb-3(b)(1), unless the authorization is terminated or revoked.  Performed at [facility], [address] [place], [place]  [id number]     Current Facility-Administered Medications  Medication Dose Route Frequency Provider Last Rate Last Admin  . thiamine tablet 100 mg  100 mg Oral Daily Merwyn KatosBradler, Evan K, MD   100 mg at 08/07/20 [id number]   Or  . thiamine (B-1) injection 100 mg  100 mg Intravenous Daily Merwyn KatosBradler, Evan K, MD       Current Outpatient Medications  Medication Sig Dispense Refill  . chlorproMAZINE (THORAZINE) 200 MG tablet Take 1 tablet (200 mg total) by mouth 2 (two) times  daily. For mood control 60 tablet 0  . haloperidol (HALDOL) 5 MG tablet Take 1 tablet (5 mg total) by mouth 2 (two) times daily. 60 tablet 0  . pantoprazole (PROTONIX) 40 MG tablet Take 1 tablet (40 mg total) by mouth daily. For acid reflux 30 tablet 0  . QUEtiapine (SEROQUEL) 300 MG tablet Take 1 tablet (300 mg total) by mouth at bedtime. For mood control 30 tablet 0    Musculoskeletal: Strength & Muscle Tone: within normal limits Gait & Station: unsteady Patient leans: N/A  Psychiatric Specialty Exam: Physical Exam Vitals and nursing note reviewed.  Constitutional:      Appearance: He is well-developed and well-nourished.  HENT:     Head: Normocephalic and atraumatic.  Eyes:     Conjunctiva/sclera: Conjunctivae normal.     Pupils: Pupils are equal, round, and reactive to light.  Cardiovascular:     Heart sounds: Normal heart sounds.  Pulmonary:     Effort: Pulmonary effort is normal.  Abdominal:     Palpations: Abdomen is soft.  Musculoskeletal:        General: Normal range of motion.     Cervical back: Normal range of motion.  Skin:    General: Skin is warm and dry.  Neurological:     General: No focal deficit present.  Psychiatric:        Attention and Perception: He is inattentive.        Speech: He is noncommunicative.     Review of Systems  Unable to perform ROS: Patient unresponsive    Blood pressure 108/73, pulse 80, temperature 98.2 F (36.8 C), temperature source Oral, resp. rate 18, height 6\' 1"  (1.854  m), weight 93 kg, SpO2 95 %.Body mass index is 27.05 kg/m.  General Appearance: Disheveled  Eye Contact:  None  Speech:  Garbled and Slurred  Volume:  Decreased  Mood:  Dysphoric  Affect:  Congruent  Thought Process:  Disorganized  Orientation:  Negative  Thought Content:  Negative  Suicidal Thoughts:  No  Homicidal Thoughts:  No  Memory:  Negative  Judgement:  Negative  Insight:  Negative  Psychomotor Activity:  Negative  Concentration:  Concentration: Negative  Recall:  Negative  Fund of Knowledge:  Negative  Language:  Negative  Akathisia:  Negative  Handed:  Right  AIMS (if indicated):     Assets:  Physical Health  ADL's:  Impaired  Cognition:  Impaired,  Moderate and Severe  Sleep:        Treatment Plan Summary: Daily contact with patient to assess and evaluate symptoms and progress in treatment, Medication management and Plan After 1 day and a half the patient remains confused and delirious.  His presentation is not typical of schizophrenia or bipolar disorder so much is ongoing delirium most likely from substance abuse.  Metabolically seems to be stable.  No seizures observed.  No neurologically focusing deficits.  At this point I have discontinued all of the detox orders.  His vitals are stable and I do not think he needs medicine for withdrawal.  I am hoping that now without any extra medication he will be able to wake up enough to have a lucid conversation at some point.  Really does not meet criteria for inpatient treatment either medically or psychiatrically.  No other changes to orders for now.  Disposition: Patient does not meet criteria for psychiatric inpatient admission. Supportive therapy provided about ongoing stressors.  , MD 08/07/2020 12:09 PM

## 2020-08-07 NOTE — ED Notes (Signed)
Given urine cup and encouraged pt to give a urine sample when he can.

## 2020-08-07 NOTE — ED Notes (Signed)
Hourly rounding completed at this time, patient currently awake in room. No complaints, stable, and in no acute distress. Q15 minute rounds and monitoring via Security Cameras to continue. 

## 2020-08-08 MED ORDER — GABAPENTIN 300 MG PO CAPS: 300.0000 mg | ORAL_CAPSULE | Freq: Three times a day (TID) | ORAL | 1 refills | Status: AC

## 2020-08-08 MED ORDER — QUETIAPINE FUMARATE 100 MG PO TABS: 100.0000 mg | ORAL_TABLET | Freq: Two times a day (BID) | ORAL | 1 refills | Status: AC

## 2020-08-08 MED ORDER — GABAPENTIN 300 MG PO CAPS: 300.0000 mg | ORAL_CAPSULE | Freq: Three times a day (TID) | ORAL | 1 refills | Status: DC

## 2020-08-08 MED ORDER — QUETIAPINE FUMARATE 100 MG PO TABS: 100.0000 mg | ORAL_TABLET | Freq: Two times a day (BID) | ORAL | 1 refills | Status: DC

## 2020-08-08 NOTE — ED Notes (Signed)
Hourly rounding completed at this time, patient currently asleep in room. No complaints, stable, and in no acute distress. Q15 minute rounds and monitoring via Security Cameras to continue. 

## 2020-08-08 NOTE — Consult Note (Signed)
[facility] Face-to-Face Psychiatry Consult   Reason for Consult: Consult for 54 year old man with history of substance abuse and mood disorder Referring Physician: Marcello Moores Patient Identification: Harold Brooks MRN:  [id number] Principal Diagnosis: Substance induced mood disorder (HCC) Diagnosis:  Principal Problem:   Substance induced mood disorder (HCC) Active Problems:   Cocaine abuse (HCC)   Severe bipolar affective disorder with psychosis (HCC)   Methamphetamine abuse (HCC)   Methamphetamine-induced psychotic disorder (HCC)   Delirium   Total Time spent with patient: 1 hour  Subjective:   [name] is a 54 y.o. male patient admitted with "I guess I lost it a little bit".  HPI: Patient seen chart reviewed.  After sleeping it off for a day and a half the patient is finally awake and able to give a history.  He remembers coming to the hospital.  He says that he was hearing voices and seeing things and that he believes he must of scared his family with whom he was staying.  He admits to me that he used methamphetamine but claims he only used it "once" and denies using cocaine or other drugs or drinking.  He is not currently on psychiatric medicine saying that he just got out of prison about a week and a half ago and has not gotten any prescriptions filled.  Currently he says he is still hearing voices but that never goes away completely.  He denies any suicidal or homicidal ideation.  Says his mood is feeling okay.  Denies any acute physical symptoms.  Past Psychiatric History: Multiple visits to the emergency room with psychosis usually in the context of stimulant abuse.  Poor insight.  Questionable other mental health problems chronically treated for symptoms of hallucinations and mood instability with diagnoses of schizophrenia PTSD and depression but most of it seems to be in the context of substance abuse  Risk to Self: Suicidal Ideation: No (UTA) Suicidal Intent: No Is patient  at risk for suicide?: No, but patient needs Medical Clearance Suicidal Plan?: No Access to Means: No What has been your use of drugs/alcohol within the last 12 months?: Cocaine, Methamphetamine, Marijuana How many times?: 1 Other Self Harm Risks:  (uta) Triggers for Past Attempts: Unknown Intentional Self Injurious Behavior: None Risk to Others: Homicidal Ideation: No Thoughts of Harm to Others: No Current Homicidal Intent: No Current Homicidal Plan: No Access to Homicidal Means: No Identified Victim: N/A History of harm to others?: No Assessment of Violence: In past 6-12 months Violent Behavior Description: Agitation Does patient have access to weapons?:  (UTA) Criminal Charges Pending?:  (UTA) 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  : No Does patient have [facility] services? : Unknown Does patient have P4CC services?: Unknown  Past Medical History:  Past Medical History:  Diagnosis Date  . Kidney stones   . Schizophrenia (HCC)    History reviewed. No pertinent surgical history. Family History: No family history on file. Family Psychiatric  History: None reported Social History:  Social History   Substance and Sexual Activity  Alcohol Use No     Social History   Substance and Sexual Activity  Drug Use Yes  . Types: Cocaine, Marijuana, Benzodiazepines, Methamphetamines   Comment: patient reported to BP he is using meth now, not crack    Social History   Socioeconomic History  .  Marital status: Unknown    Spouse name: Not on file  . Number of children: Not on file  . Years of education: Not on file  . Highest education level: Not on file  Occupational History  . Not on file  Tobacco Use  . Smoking status: Current Every Day Smoker    Types: Cigarettes  . Smokeless tobacco:  Never Used  Vaping Use  . Vaping Use: Never used  Substance and Sexual Activity  . Alcohol use: No  . Drug use: Yes    Types: Cocaine, Marijuana, Benzodiazepines, Methamphetamines    Comment: patient reported to BP he is using meth now, not crack  . Sexual activity: Yes    Birth control/protection: None  Other Topics Concern  . Not on file  Social History Narrative  . Not on file   Social Determinants of Health   Financial Resource Strain: Not on file  Food Insecurity: Not on file  Transportation Needs: Not on file  Physical Activity: Not on file  Stress: Not on file  Social Connections: Not on file   Additional Social History:    Allergies:  No Known Allergies  Labs:  Results for orders placed or performed during the hospital encounter of 08/06/20 (from the past 48 hour(s))  Resp Panel by RT-PCR (Flu A&B, Covid) Nasopharyngeal Swab     Status: None   Collection Time: 08/06/20  4:38 PM   Specimen: Nasopharyngeal Swab; Nasopharyngeal(NP) swabs in vial transport medium  Result Value Ref Range   SARS Coronavirus 2 by RT PCR NEGATIVE NEGATIVE    Comment: (NOTE) SARS-CoV-2 target nucleic acids are NOT DETECTED.  The SARS-CoV-2 RNA is generally detectable in upper respiratory specimens during the acute phase of infection. The lowest concentration of SARS-CoV-2 viral copies this assay can detect is 138 copies/mL. A negative result does not preclude SARS-Cov-2 infection and should not be used as the sole basis for treatment or other patient management decisions. A negative result may occur with  improper specimen collection/handling, submission of specimen other than nasopharyngeal swab, presence of viral mutation(s) within the areas targeted by this assay, and inadequate number of viral copies(<138 copies/mL). A negative result must be combined with clinical observations, patient history, and epidemiological information. The expected result is Negative.  Fact Sheet for  Patients:  BloggerCourse.com  Fact Sheet for Healthcare Providers:  SeriousBroker.it  This test is no t yet approved or cleared by the [place] FDA and  has been authorized for detection and/or diagnosis of SARS-CoV-2 by FDA under an Emergency Use Authorization (EUA). This EUA will remain  in effect (meaning this test can be used) for the duration of the COVID-19 declaration under Section 564(b)(1) of the Act, 21 U.S.C.section 360bbb-3(b)(1), unless the authorization is terminated  or revoked sooner.       Influenza A by PCR NEGATIVE NEGATIVE   Influenza B by PCR NEGATIVE NEGATIVE    Comment: (NOTE) The Xpert Xpress SARS-CoV-2/FLU/RSV plus assay is intended as an aid in the diagnosis of influenza from Nasopharyngeal swab specimens and should not be used as a sole basis for treatment. Nasal washings and aspirates are unacceptable for Xpert Xpress SARS-CoV-2/FLU/RSV testing.  Fact Sheet for Patients: BloggerCourse.com  Fact Sheet for Healthcare Providers: SeriousBroker.it  This test is not yet approved or cleared by the [place] FDA and has been authorized for detection and/or diagnosis of SARS-CoV-2 by FDA under an Emergency Use Authorization (EUA). This EUA will remain in effect (meaning this test can be  used) for the duration of the COVID-19 declaration under Section 564(b)(1) of the Act, 21 U.S.C. section 360bbb-3(b)(1), unless the authorization is terminated or revoked.  Performed at [facility], [address] [place], [place] [address]     Current Facility-Administered Medications  Medication Dose Route Frequency Provider Last Rate Last Admin  . thiamine tablet 100 mg  100 mg Oral Daily Merwyn Katos, MD   100 mg at 08/07/20 8242   Or  . thiamine (B-1) injection 100 mg  100 mg Intravenous Daily Merwyn Katos, MD       Current Outpatient  Medications  Medication Sig Dispense Refill  . chlorproMAZINE (THORAZINE) 200 MG tablet Take 1 tablet (200 mg total) by mouth 2 (two) times daily. For mood control 60 tablet 0  . gabapentin (NEURONTIN) 300 MG capsule Take 1 capsule (300 mg total) by mouth 3 (three) times daily. 90 capsule 1  . haloperidol (HALDOL) 5 MG tablet Take 1 tablet (5 mg total) by mouth 2 (two) times daily. 60 tablet 0  . pantoprazole (PROTONIX) 40 MG tablet Take 1 tablet (40 mg total) by mouth daily. For acid reflux 30 tablet 0  . QUEtiapine (SEROQUEL) 100 MG tablet Take 1 tablet (100 mg total) by mouth 2 (two) times daily. 60 tablet 1    Musculoskeletal: Strength & Muscle Tone: within normal limits Gait & Station: normal Patient leans: N/A  Psychiatric Specialty Exam: Physical Exam Vitals and nursing note reviewed.  Constitutional:      Appearance: He is well-developed and well-nourished.  HENT:     Head: Normocephalic and atraumatic.  Eyes:     Conjunctiva/sclera: Conjunctivae normal.     Pupils: Pupils are equal, round, and reactive to light.  Cardiovascular:     Heart sounds: Normal heart sounds.  Pulmonary:     Effort: Pulmonary effort is normal.  Abdominal:     Palpations: Abdomen is soft.  Musculoskeletal:        General: Normal range of motion.     Cervical back: Normal range of motion.  Skin:    General: Skin is warm and dry.  Neurological:     General: No focal deficit present.     Mental Status: He is alert.  Psychiatric:        Mood and Affect: Mood normal.        Thought Content: Thought content normal.     Review of Systems  Constitutional: Negative.   HENT: Negative.   Eyes: Negative.   Respiratory: Negative.   Cardiovascular: Negative.   Gastrointestinal: Negative.   Musculoskeletal: Negative.   Skin: Negative.   Neurological: Negative.   Psychiatric/Behavioral: Positive for confusion, hallucinations and sleep disturbance. Negative for self-injury and suicidal ideas. The  patient is nervous/anxious.     Blood pressure 132/81, pulse 79, temperature 98 F (36.7 C), temperature source Oral, resp. rate 16, height 6\' 1"  (1.854 m), weight 93 kg, SpO2 98 %.Body mass index is 27.05 kg/m.  General Appearance: Disheveled  Eye Contact:  Fair  Speech:  Slow  Volume:  Decreased  Mood:  Euthymic  Affect:  Congruent  Thought Process:  Goal Directed  Orientation:  Full (Time, Place, and Person)  Thought Content:  Logical  Suicidal Thoughts:  No  Homicidal Thoughts:  No  Memory:  Immediate;   Fair Recent;   Poor Remote;   Fair  Judgement:  Impaired  Insight:  Shallow  Psychomotor Activity:  Decreased  Concentration:  Concentration: Fair  Recall:  Fair  Progress EnergyFund of Knowledge:  Fair  Language:  Fair  Akathisia:  No  Handed:  Right  AIMS (if indicated):     Assets:  Desire for Improvement  ADL's:  Impaired  Cognition:  Impaired,  Mild  Sleep:        Treatment Plan Summary: Medication management and Plan Patient states that he thinks he did fairly well when he was on Seroquel and Neurontin along with some other medicines.  I agreed to give him a prescription to start Seroquel back at a total of 200 mg a day and Neurontin 300 mg 3 times a day.  Comparing this to old chart it looks like that is slightly less than what he has been on in the past but it would take him a while to get ramped up to it.  Psychoeducation done about substance abuse.  Strongly encourage patient once his Medicaid is in place to get in touch with [facility] where he has gone for treatment in the past but also to meanwhile go to [facility].  At this point no longer meets commitment criteria.  Case reviewed with emergency room doctor and TTS.  Discontinue IVC patient may be discharged from the emergency room.  Disposition: No evidence of imminent risk to self or others at present.   Patient does not meet criteria for psychiatric inpatient admission. Supportive therapy provided about ongoing stressors.  Mordecai RasmussenJohn  Adan Baehr, MD 08/08/2020 11:39 AM

## 2020-08-08 NOTE — ED Notes (Signed)
He is sleeping  VS to be obtained when he awakens  

## 2020-08-08 NOTE — ED Notes (Signed)
No v-signs patient sleeping at this time 

## 2020-08-08 NOTE — ED Notes (Signed)
Pt asleep at this time, unable to collect vitals. Will collect pt vitals once awake. 

## 2020-09-06 ENCOUNTER — Other Ambulatory Visit: Payer: Self-pay

## 2020-09-06 ENCOUNTER — Emergency Department
Admission: EM | Admit: 2020-09-06 | Discharge: 2020-09-06 | Disposition: A | Discharge status: Home / Self Care | Payer: Self-pay | Attending: Emergency Medicine | Admitting: Emergency Medicine

## 2020-09-06 ENCOUNTER — Encounter: Payer: Self-pay | Admitting: Emergency Medicine

## 2020-09-06 DIAGNOSIS — F1510 Other stimulant abuse, uncomplicated: Secondary | ICD-10-CM | POA: Insufficient documentation

## 2020-09-06 DIAGNOSIS — F23 Brief psychotic disorder: Secondary | ICD-10-CM | POA: Insufficient documentation

## 2020-09-06 DIAGNOSIS — Z046 Encounter for general psychiatric examination, requested by authority: Secondary | ICD-10-CM | POA: Insufficient documentation

## 2020-09-06 DIAGNOSIS — F3164 Bipolar disorder, current episode mixed, severe, with psychotic features: Secondary | ICD-10-CM | POA: Insufficient documentation

## 2020-09-06 DIAGNOSIS — F1910 Other psychoactive substance abuse, uncomplicated: Secondary | ICD-10-CM

## 2020-09-06 DIAGNOSIS — F600 Paranoid personality disorder: Secondary | ICD-10-CM | POA: Insufficient documentation

## 2020-09-06 DIAGNOSIS — F1410 Cocaine abuse, uncomplicated: Secondary | ICD-10-CM | POA: Insufficient documentation

## 2020-09-06 DIAGNOSIS — F17210 Nicotine dependence, cigarettes, uncomplicated: Secondary | ICD-10-CM | POA: Insufficient documentation

## 2020-09-06 LAB — CBC
HCT: 39.8 % (ref 39.0–52.0)
Hemoglobin: 13.8 g/dL (ref 13.0–17.0)
MCH: 32.5 pg (ref 26.0–34.0)
MCHC: 34.7 g/dL (ref 30.0–36.0)
MCV: 93.6 fL (ref 80.0–100.0)
Platelets: 331 10*3/uL (ref 150–400)
RBC: 4.25 MIL/uL (ref 4.22–5.81)
RDW: 12.7 % (ref 11.5–15.5)
WBC: 6.4 10*3/uL (ref 4.0–10.5)
nRBC: 0 % (ref 0.0–0.2)

## 2020-09-06 LAB — COMPREHENSIVE METABOLIC PANEL
ALT: 20 U/L (ref 0–44)
AST: 28 U/L (ref 15–41)
Albumin: 3.9 g/dL (ref 3.5–5.0)
Alkaline Phosphatase: 78 U/L (ref 38–126)
Anion gap: 10 (ref 5–15)
BUN: 9 mg/dL (ref 6–20)
CO2: 25 mmol/L (ref 22–32)
Calcium: 9 mg/dL (ref 8.9–10.3)
Chloride: 104 mmol/L (ref 98–111)
Creatinine, Ser: 0.88 mg/dL (ref 0.61–1.24)
GFR, Estimated: >60 mL/min (ref >60)
Glucose, Bld: 85 mg/dL (ref 70–99)
Potassium: 4 mmol/L (ref 3.5–5.1)
Sodium: 139 mmol/L (ref 135–145)
Total Bilirubin: 0.9 mg/dL (ref 0.3–1.2)
Total Protein: 6.9 g/dL (ref 6.5–8.1)

## 2020-09-06 LAB — ETHANOL: Alcohol, Ethyl (B): <10 mg/dL (ref <10)

## 2020-09-06 LAB — ACETAMINOPHEN LEVEL: Acetaminophen (Tylenol), Serum: <10 ug/mL — ABNORMAL LOW (ref 10–30)

## 2020-09-06 LAB — SALICYLATE LEVEL: Salicylate Lvl: <7 mg/dL — ABNORMAL LOW (ref 7.0–30.0)

## 2020-09-06 MED ORDER — ZIPRASIDONE MESYLATE 20 MG IM SOLR
20.0000 mg | Freq: Once | INTRAMUSCULAR | Status: AC
  Administered 2020-09-06: 20 mg via INTRAMUSCULAR

## 2020-09-06 NOTE — BH Assessment (Signed)
Comprehensive Clinical Assessment (CCA) Note  09/06/2020 Harold Brooks [id number]  Chief Complaint: Patient is a 55 year old male presenting to [facility] ED via EMS but has since been IVC'd. Per triage note Patient brought in by ems from a cousins house. Patient has a history of schizophrenia and has not been taking his medications. During assessment patient appears alert and oriented x1, patient is unaware of where he is, the situation or time/day. Patient speech is pressured, loud, thoughts are disorganized and tangential. Upon entering the room patient reports "I know who you are, you're crazy, my mind is fucked up." Patient presented restless, irritable, delusional and paranoid. When asked if patient is experiencing AH patient reports "yes, they tell me all kinds of things." When asked if patient's AH command him to hurt other people patient denies. Patient was also able to report that he has not been sleeping. Patient does have a history of substance abuse but patient did not report if he had used any substances or not. No UDS is currently available at this time. Patient would not allow assessment to continue and told writer to leave him alone. Patient denies SI/HI/ reports AH/VH patient is currently responding to internal stimuli.   Patient disposition pending  Chief Complaint  Patient presents with  . Psychiatric Evaluation   Visit Diagnosis: Bipolar affective disorder with psychosis per hx, Methamphetamine abuse by hx, Cocaine Abuse by hx    CCA Screening, Triage and Referral (STR)  Patient Reported Information How did you hear about [place]? Other (Comment)  Referral name: No data recorded Referral phone number: No data recorded  Whom do you see for routine medical problems? Other (Comment)  Practice/Facility Name: No data recorded Practice/Facility Phone Number: No data recorded Name of Contact: No data recorded Contact Number: No data recorded Contact Fax Number: No data  recorded Prescriber Name: No data recorded Prescriber Address (if known): No data recorded  What Is the Reason for Your Visit/Call Today? Patient brought in via EMS from cousin's house due to not taking his medications  How Long Has This Been Causing You Problems? > than 6 months  What Do You Feel Would Help You the Most Today? Medication; Therapy; Assessment Only   Have You Recently Been in Any Inpatient Treatment (Hospital/Detox/Crisis Center/28-Day Program)? Yes  Name/Location of Program/Hospital:Unknown  How Long Were You There? Unknown  When Were You Discharged? No data recorded  Have You Ever Received Services From [facility] Before? Yes  Who Do You See at [facility]? ER care   Have You Recently Had Any Thoughts About Hurting Yourself? No  Are You Planning to Commit Suicide/Harm Yourself At This time? No   Have you Recently Had Thoughts About Hurting Someone Karolee Ohs? No  Explanation: No data recorded  Have You Used Any Alcohol or Drugs in the Past 24 Hours? -- (Unknown)  How Long Ago Did You Use Drugs or Alcohol? No data recorded What Did You Use and How Much? No data recorded  Do You Currently Have a Therapist/Psychiatrist? No  Name of Therapist/Psychiatrist: No data recorded  Have You Been Recently Discharged From Any Office Practice or Programs? No  Explanation of Discharge From Practice/Program: No data recorded    CCA Screening Triage Referral Assessment Type of Contact: Face-to-Face  Is this Initial or Reassessment? No data recorded Date Telepsych consult ordered in [facility]:  08/06/2020  Time Telepsych consult ordered in [facility]:  0301   Patient Reported Information Reviewed? Yes  Patient Left Without Being  Seen? No data recorded Reason for Not Completing Assessment: No data recorded  Collateral Involvement: No data recorded  Does Patient Have a Court Appointed Legal Guardian? No data recorded Name and Contact of Legal Guardian: self  If Minor and  Not Living with Parent(s), Who has Custody? n/a  Is CPS involved or ever been involved? Never  Is APS involved or ever been involved? Never   Patient Determined To Be At Risk for Harm To Self or Others Based on Review of Patient Reported Information or Presenting Complaint? No  Method: No data recorded Availability of Means: No data recorded Intent: No data recorded Notification Required: No data recorded Additional Information for Danger to Others Potential: No data recorded Additional Comments for Danger to Others Potential: No data recorded Are There Guns or Other Weapons in Your Home? No data recorded Types of Guns/Weapons: No data recorded Are These Weapons Safely Secured?                            No data recorded Who Could Verify You Are Able To Have These Secured: No data recorded Do You Have any Outstanding Charges, Pending Court Dates, Parole/Probation? No data recorded Contacted To Inform of Risk of Harm To Self or Others: No data recorded  Location of Assessment: [facility] ED   Does Patient Present under Involuntary Commitment? Yes  IVC Papers Initial File Date: 09/06/2020   [place] of Residence:    Patient Currently Receiving the Following Services: No data recorded  Determination of Need: Emergent (2 hours)   Options For Referral: No data recorded    CCA Biopsychosocial Intake/Chief Complaint:  Patient presenting via EMS from his cousin's house due to patient not taking his medications  Current Symptoms/Problems: Non compliance with medications, actively psychotic, disorganzied thoughts, paranoid   Patient Reported Schizophrenia/Schizoaffective Diagnosis in Past: Yes   Strengths: UTA  Preferences: UTA  Abilities: UTA   Type of Services Patient Feels are Needed: UTA   Initial Clinical Notes/Concerns: None   Mental Health Symptoms Depression:  None   Duration of Depressive symptoms: No data recorded  Mania:  Irritability; Racing  thoughts   Anxiety:   Irritability; Difficulty concentrating; Restlessness   Psychosis:  Delusions; Grossly disorganized speech; Hallucinations   Duration of Psychotic symptoms: Greater than six months   Trauma:  None   Obsessions:  Poor insight   Compulsions:  Absent insight/delusional; Poor Insight   Inattention:  Disorganized   Hyperactivity/Impulsivity:  Blurts out answers; Feeling of restlessness   Oppositional/Defiant Behaviors:  None   Emotional Irregularity:  Intense/inappropriate anger   Other Mood/Personality Symptoms:  No data recorded   Mental Status Exam Appearance and self-care  Stature:  Average   Weight:  Average weight   Clothing:  Disheveled   Grooming:  Neglected   Cosmetic use:  None   Posture/gait:  Normal   Motor activity:  Agitated   Sensorium  Attention:  Confused; Distractible   Concentration:  Preoccupied; Scattered   Orientation:  Person   Recall/memory:  Defective in Short-term   Affect and Mood  Affect:  Anxious; Labile   Mood:  Anxious; Irritable; Hypomania   Relating  Eye contact:  Fleeting   Facial expression:  Anxious   Attitude toward examiner:  Suspicious; Defensive; Guarded   Thought and Language  Speech flow: Flight of Ideas; Garbled; Loud; Pressured   Thought content:  Delusions; Suspicious   Preoccupation:  None   Hallucinations:  Auditory;  Command (Comment); Visual ("they tell me all kinds of things")   Organization:  No data recorded  Executive Functions  Fund of Knowledge:  Average   Intelligence:  Average   Abstraction:  Functional   Judgement:  Poor   Reality Testing:  Distorted   Insight:  Poor   DecisAffiliated Computer Servicesion Making:  Impulsive   Social Functioning  Social Maturity:  Irresponsible   Social Judgement:  Normal   Stress  Stressors:  Other (Comment)   Coping Ability:  Deficient supports   Skill Deficits:  None   Supports:  Support needed     Religion: Religion/Spirituality Are  You A Religious Person?: No  Leisure/Recreation: Leisure / Recreation Do You Have Hobbies?: No  Exercise/Diet: Exercise/Diet Do You Exercise?: No Have You Gained or Lost A Significant Amount of Weight in the Past Six Months?: No Do You Follow a Special Diet?: No Do You Have Any Trouble Sleeping?: No   CCA Employment/Education Employment/Work Situation: Employment / Work Psychologist, occupationalituation Employment situation: Unemployed Has patient ever been in the Eli Lilly and Companymilitary?: No  Education: Education Is Patient Currently Attending School?: No Did Garment/textile technologistYou Graduate From [employer]High School?: Yes Did Theme park managerYou Attend College?: No Did Designer, television/film setYou Attend Graduate School?: No Did You Have An Individualized Education Program (IIEP): No Did You Have Any Difficulty At Progress EnergySchool?: No Patient's Education Has Been Impacted by Current Illness: No   CCA Family/Childhood History Family and Relationship History: Family history Marital status: Single Are you sexually active?:  (Unknown)  Childhood History:  Childhood History By whom was/is the patient raised?: Other (Comment) (Unknown) Additional childhood history information: UTA Description of patient's relationship with caregiver when they were a child: UTA Patient's description of current relationship with people who raised him/her: UTA How were you disciplined when you got in trouble as a child/adolescent?: UTA Does patient have siblings?:  (UTA) Did patient suffer any verbal/emotional/physical/sexual abuse as a child?:  (UTA) Did patient suffer from severe childhood neglect?:  (UTA) Has patient ever been sexually abused/assaulted/raped as an adolescent or adult?:  (UTA) Was the patient ever a victim of a crime or a disaster?:  (UTA) Witnessed domestic violence?:  (UTA) Has patient been affected by domestic violence as an adult?:  Industrial/product designer(UTA)  Child/Adolescent Assessment:     CCA Substance Use Alcohol/Drug Use: Alcohol / Drug Use Pain Medications: see mar Prescriptions: see  mar Over the Counter: see mar History of alcohol / drug use?: Yes Substance #1 Name of Substance 1: Methamphetamine Substance #2 Name of Substance 2: Cocaine Substance #3 Name of Substance 3: Marijuana                   ASAM's:  Six Dimensions of Multidimensional Assessment  Dimension 1:  Acute Intoxication and/or Withdrawal Potential:      Dimension 2:  Biomedical Conditions and Complications:      Dimension 3:  Emotional, Behavioral, or Cognitive Conditions and Complications:     Dimension 4:  Readiness to Change:     Dimension 5:  Relapse, Continued use, or Continued Problem Potential:     Dimension 6:  Recovery/Living Environment:     ASAM Severity Score:    ASAM Recommended Level of Treatment:     Substance use Disorder (SUD)    Recommendations for Services/Supports/Treatments:  Patient disposition pending  DSM5 Diagnoses: Patient Active Problem List   Diagnosis Date Noted  . Delirium 08/07/2020  . 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 Paranoia (HCC)   . 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  Substance induced mood disorder (HCC) 12/05/2014  . 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 PTSD (post-traumatic stress disorder) 01/13/2013  . Cocaine abuse (HCC) 01/13/2013    Patient Centered Plan: Patient is on the following Treatment Plan(s):  Substance Abuse, Bipolar   Referrals to Alternative Service(s): Referred to Alternative Service(s):   Place:   Date:   Time:    Referred to Alternative Service(s):   Place:   Date:   Time:    Referred to Alternative Service(s):   Place:   Date:   Time:    Referred to Alternative Service(s):   Place:   Date:   Time:     Temekia Caskey A Jamesrobert Ohanesian, LCAS-A

## 2020-09-06 NOTE — ED Notes (Signed)
Pt is asleep. Vitals will be obtained when Pt awakens.

## 2020-09-06 NOTE — ED Notes (Signed)
Patient screaming and disrupting the unit. He was unable to calm down. Staff and officer unable to redirect. EDP aware.

## 2020-09-06 NOTE — ED Notes (Signed)
Hourly rounding reveals patient in room. No complaints, stable, in no acute distress. Q15 minute rounds and monitoring via Rover and Officer to continue.   

## 2020-09-06 NOTE — ED Notes (Signed)
IVC/pending SOC consult. 

## 2020-09-06 NOTE — ED Notes (Signed)
Pt discharged home. VS stable. Pt denies SI/HI.  All belongings (wallet) returned to patient.  Discharge instructions reviewed with patient.

## 2020-09-06 NOTE — ED Provider Notes (Signed)
[facility] Emergency Department Provider Note  ____________________________________________  Time seen: Approximately 4:08 AM  I have reviewed the triage vital signs and the nursing notes.   HISTORY  Chief Complaint Psychiatric Evaluation  Level 5 caveat:  Portions of the history and physical were unable to be obtained due to acute psychosis '  HPI Harold Brooks is a 55 y.o. male with a history of schizophrenia, medication noncompliance, PTSD, methamphetamine abuse, cocaine abuse who presents to the emergency room for psychiatric evaluation.  EMS was called by patient's cousin.  Patient has not been taking his medications.  Patient is psychotic, combative and agitated.  Talking about the police officers being the devil.  Unable to provide any history   Past Medical History:  Diagnosis Date  . Kidney stones   . Schizophrenia [facility]     Patient Active Problem List   Diagnosis Date Noted  . Delirium 08/07/2020  . 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 Paranoia (HCC)   . 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 Substance induced mood disorder (HCC) 12/05/2014  . 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 PTSD (post-traumatic stress disorder) 01/13/2013  . Cocaine abuse (HCC) 01/13/2013    History reviewed. No pertinent surgical history.  Prior to Admission medications   Medication Sig Start Date End Date Taking? Authorizing Provider  chlorproMAZINE (THORAZINE) 200 MG tablet Take 1 tablet (200 mg total) by mouth 2 (two) times daily. For mood control 12/09/14   Armandina Stammer I, NP  gabapentin (NEURONTIN) 300 MG capsule Take 1 capsule (300 mg total) by mouth 3 (three) times daily. 08/08/20 10/07/20  Gilles Chiquito, MD  haloperidol (HALDOL) 5 MG tablet Take 1 tablet (5 mg total) by mouth 2 (two) times daily. 09/09/19   Charm Rings, NP  pantoprazole  (PROTONIX) 40 MG tablet Take 1 tablet (40 mg total) by mouth daily. For acid reflux 12/09/14   Armandina Stammer I, NP  QUEtiapine (SEROQUEL) 100 MG tablet Take 1 tablet (100 mg total) by mouth 2 (two) times daily. 08/08/20   Gilles Chiquito, MD    Allergies Patient has no known allergies.  No family history on file.  Social History Social History   Tobacco Use  . Smoking status: Current Every Day Smoker    Types: Cigarettes  . Smokeless tobacco: Never Used  Vaping Use  . Vaping Use: Never used  Substance Use Topics  . Alcohol use: No  . Drug use: Yes    Types: Cocaine, Marijuana, Benzodiazepines, Methamphetamines    Comment: patient reported to BP he is using meth now, not crack    Review of Systems  Constitutional: Negative for fever. Cardiovascular: Negative for chest pain. Respiratory: Negative for shortness of breath. Gastrointestinal: Negative for abdominal pain Psych: + psychosis   Level 5 caveat:  Portions of the history and physical were unable to be obtained due to acute psychosis  ____________________________________________   PHYSICAL EXAM:  VITAL SIGNS: ED Triage Vitals  Enc Vitals Group     BP      Pulse      Resp      Temp      Temp src      SpO2      Weight      Height      Head Circumference      Peak Flow      Pain Score  Pain Loc      Pain Edu?      Excl. in GC?     Constitutional: Alert, combative and agitated.  HEENT:      Head: Normocephalic and atraumatic.         Eyes: Conjunctivae are normal. Sclera is non-icteric.       Mouth/Throat: Mucous membranes are moist.       Neck: Supple with no signs of meningismus. Cardiovascular: Regular rate and rhythm.  Respiratory: Normal respiratory effort.  Gastrointestinal: Soft, non tender, and non distended. Musculoskeletal: No edema, cyanosis, or erythema of extremities. Neurologic: Normal speech and language. Face is symmetric. Moving all extremities. No gross focal neurologic  deficits are appreciated. Skin: Skin is warm, dry and intact. No rash noted. Psychiatric: Acute psychosis, pressured speech, agitated, combative  ____________________________________________   LABS (all labs ordered are listed, but only abnormal results are displayed)  Labs Reviewed  SALICYLATE LEVEL - Abnormal; Notable for the following components:      Result Value   Salicylate Lvl <7.0 (*)    All other components within normal limits  ACETAMINOPHEN LEVEL - Abnormal; Notable for the following components:   Acetaminophen (Tylenol), Serum <10 (*)    All other components within normal limits  RESP PANEL BY RT-PCR (FLU A&B, COVID) ARPGX2  CBC  COMPREHENSIVE METABOLIC PANEL  ETHANOL  URINE DRUG SCREEN, QUALITATIVE ([facility] ONLY)   ____________________________________________  EKG  none  ____________________________________________  RADIOLOGY  none  ____________________________________________   PROCEDURES  Procedure(s) performed: None Procedures Critical Care performed:  None ____________________________________________   INITIAL IMPRESSION / ASSESSMENT AND PLAN / ED COURSE   55 y.o. male with a history of schizophrenia, medication noncompliance, PTSD, methamphetamine abuse, cocaine abuse who presents to the emergency room for psychiatric evaluation.  Patient presents with acute psychosis, agitated and combative.  For patient and staff safety patient was given 20 mg of IM Geodon.  Labs for medical clearance are pending.  Psychiatry has been consulted.  Review of old medical records show several prior presentations that are the same as this 1 consistent with schizophrenia and polysubstance induced psychosis.    The patient has been placed in psychiatric observation due to the need to provide a safe environment for the patient while obtaining psychiatric consultation and evaluation, as well as ongoing medical and medication management to treat the patient's condition.  The  patient has been placed under full IVC at this time.    Please note:  Patient was evaluated in Emergency Department today for the symptoms described in the history of present illness. Patient was evaluated in the context of the global COVID-19 pandemic, which necessitated consideration that the patient might be at risk for infection with the SARS-CoV-2 virus that causes COVID-19. Institutional protocols and algorithms that pertain to the evaluation of patients at risk for COVID-19 are in a state of rapid change based on information released by regulatory bodies including the CDC and federal and state organizations. These policies and algorithms were followed during the patient's care in the ED.  Some ED evaluations and interventions may be delayed as a result of limited staffing during the pandemic.   ____________________________________________   FINAL CLINICAL IMPRESSION(S) / ED DIAGNOSES   Final diagnoses:  Acute psychosis (HCC)      NEW MEDICATIONS STARTED DURING THIS VISIT:  ED Discharge Orders    None       Note:  This document was prepared using Dragon voice recognition software and may include unintentional dictation  errors.    Nita Sickle, MD 09/06/20 [phone]

## 2020-09-06 NOTE — ED Notes (Addendum)
Pt. BIB EMS after his cousin called and told them that the patient hasn't been taking his medication. Patient is in  Room 21 dressing out and screening for contraband.Pt. Oriented to [employer] including Q15 minute rounds as well as Psychologist, counselling for their protection. Patient is alert warm and dry in no acute distress.UTA SI, HI, and AVH. Pt. Encouraged to let me know if needs arise.

## 2020-09-06 NOTE — ED Notes (Signed)
Report given to SOC 

## 2020-09-06 NOTE — ED Notes (Signed)
Pt dressing for discharge.  

## 2020-09-06 NOTE — ED Notes (Signed)
Pt is awake, given warm blanket and coke to drink.

## 2020-09-06 NOTE — ED Provider Notes (Addendum)
IVC rescinded by psychiatry. They suspect much of his presentation is due to intoxication. He is now denying any SI, HI, AVH. No apparent medical emergency. D/c with outpt resources. He was encouraged to take his medications. He has prescriptions already called in for these per Epic.   Shaune Pollack, MD 09/06/20 Karleen Hampshire    Shaune Pollack, MD 09/06/20 2012

## 2020-09-06 NOTE — ED Notes (Addendum)
Hourly rounding reveals patient in room. No complaints, stable, in no acute distress. Q15 minute rounds and monitoring via Rover and Officer to continue.   

## 2020-09-06 NOTE — ED Triage Notes (Signed)
Patient brought in by ems from a cousins house. Patient has a history of schizophrenia and has not been taking his medications.

## 2020-09-26 ENCOUNTER — Other Ambulatory Visit: Payer: Self-pay

## 2020-09-26 ENCOUNTER — Emergency Department
Admission: EM | Admit: 2020-09-26 | Discharge: 2020-09-26 | Disposition: A | Discharge status: Home / Self Care | Payer: Self-pay | Attending: Emergency Medicine | Admitting: Emergency Medicine

## 2020-09-26 ENCOUNTER — Encounter: Payer: Self-pay | Admitting: Emergency Medicine

## 2020-09-26 DIAGNOSIS — R4789 Other speech disturbances: Secondary | ICD-10-CM | POA: Insufficient documentation

## 2020-09-26 DIAGNOSIS — R499 Unspecified voice and resonance disorder: Secondary | ICD-10-CM | POA: Insufficient documentation

## 2020-09-26 DIAGNOSIS — F419 Anxiety disorder, unspecified: Secondary | ICD-10-CM | POA: Insufficient documentation

## 2020-09-26 DIAGNOSIS — Z5321 Procedure and treatment not carried out due to patient leaving prior to being seen by health care provider: Secondary | ICD-10-CM | POA: Insufficient documentation

## 2020-09-26 DIAGNOSIS — F209 Schizophrenia, unspecified: Secondary | ICD-10-CM | POA: Insufficient documentation

## 2020-09-26 DIAGNOSIS — Z760 Encounter for issue of repeat prescription: Secondary | ICD-10-CM | POA: Insufficient documentation

## 2020-09-26 LAB — COMPREHENSIVE METABOLIC PANEL
ALT: 25 U/L (ref 0–44)
AST: 20 U/L (ref 15–41)
Albumin: 4.5 g/dL (ref 3.5–5.0)
Alkaline Phosphatase: 104 U/L (ref 38–126)
Anion gap: 14 (ref 5–15)
BUN: <5 mg/dL — ABNORMAL LOW (ref 6–20)
CO2: 26 mmol/L (ref 22–32)
Calcium: 9.4 mg/dL (ref 8.9–10.3)
Chloride: 102 mmol/L (ref 98–111)
Creatinine, Ser: 0.83 mg/dL (ref 0.61–1.24)
GFR, Estimated: >60 mL/min (ref >60)
Glucose, Bld: 97 mg/dL (ref 70–99)
Potassium: 3.1 mmol/L — ABNORMAL LOW (ref 3.5–5.1)
Sodium: 142 mmol/L (ref 135–145)
Total Bilirubin: 0.7 mg/dL (ref 0.3–1.2)
Total Protein: 8.1 g/dL (ref 6.5–8.1)

## 2020-09-26 LAB — CBC
HCT: 45 % (ref 39.0–52.0)
Hemoglobin: 15.7 g/dL (ref 13.0–17.0)
MCH: 32.3 pg (ref 26.0–34.0)
MCHC: 34.9 g/dL (ref 30.0–36.0)
MCV: 92.6 fL (ref 80.0–100.0)
Platelets: 408 10*3/uL — ABNORMAL HIGH (ref 150–400)
RBC: 4.86 MIL/uL (ref 4.22–5.81)
RDW: 12.8 % (ref 11.5–15.5)
WBC: 9.9 10*3/uL (ref 4.0–10.5)
nRBC: 0 % (ref 0.0–0.2)

## 2020-09-26 NOTE — ED Notes (Signed)
Pt ambulated to quad area after this nurse received report from [place], [place]. Pt gets to quad and refuses to enter room stating that "y'all just gone lock me in there and shoot me up with medicine." Pt informed that due to loudness and actions he must go into room and cannot stay in hallway. Pt adamant, cussing at staff, stating that he has people outside watching [place], and he knows [place] from past visits so he knows what we will do. Pt after being told again that he is not to be in hallway states, "fuck that, I am leaving. I'm going to another hospital and don't send your people after me." Pt out of quad and leaves to lobby without belongings. Dr. York Cerise is informed of situation and states pt does not require IVC and pt can leave. Pt is not SI or HI with this nurse interaction, pt paranoid, verbally aggressive and loud, cussing at staff. Pt belongings collected by this nurse and carried to lobby and provided to pt. Security collects pt cash and give back to pt, witnessed by this nurse and pt refuses to sign paper for receivale. This interaction occurred in gondola at lobby entrance.

## 2020-09-26 NOTE — ED Notes (Addendum)
Pt dressed out in blue scrubs. Pt belongings: sneakers, brown pants, underwear, jacket, socks, blue shirt. Wallet with a key, drivers lic, and card. Cup with $0.65. Valuables envelope with $83.00.

## 2020-09-26 NOTE — ED Triage Notes (Signed)
Pt reports he is Schizophrenic and ran out of his medications. Pt has pressure speech, speaking load, seems anxious. Denies any SI, HI denies any visual or auditory hallucinations.

## 2020-09-27 LAB — ETHANOL: Alcohol, Ethyl (B): <10 mg/dL (ref <10)
